# Patient Record
Sex: Female | Born: 1970 | Hispanic: No | Marital: Married | Smoking: Never smoker
Health system: Southern US, Community
[De-identification: ages and names within clinical notes are randomized; demographics above are authoritative.]

## PROBLEM LIST (undated history)

## (undated) DIAGNOSIS — B159 Hepatitis A without hepatic coma: Secondary | ICD-10-CM

## (undated) HISTORY — DX: Hepatitis a without hepatic coma: B15.9

## (undated) HISTORY — PX: TUBAL LIGATION: SHX77

---

## 2007-04-12 ENCOUNTER — Observation Stay (HOSPITAL_COMMUNITY): Admission: AD | Admit: 2007-04-12 | Discharge: 2007-04-13 | Payer: Self-pay | Admitting: Obstetrics and Gynecology

## 2007-07-25 ENCOUNTER — Inpatient Hospital Stay (HOSPITAL_COMMUNITY): Admission: RE | Admit: 2007-07-25 | Discharge: 2007-07-28 | Payer: Self-pay | Admitting: Obstetrics and Gynecology

## 2007-07-29 ENCOUNTER — Encounter (HOSPITAL_COMMUNITY): Admission: RE | Admit: 2007-07-29 | Discharge: 2007-08-27 | Payer: Self-pay | Admitting: Obstetrics and Gynecology

## 2007-08-06 ENCOUNTER — Inpatient Hospital Stay (HOSPITAL_COMMUNITY): Admission: AD | Admit: 2007-08-06 | Discharge: 2007-08-06 | Payer: Self-pay | Admitting: Obstetrics and Gynecology

## 2007-08-28 ENCOUNTER — Encounter: Admission: RE | Admit: 2007-08-28 | Discharge: 2007-09-27 | Payer: Self-pay | Admitting: Obstetrics and Gynecology

## 2007-09-28 ENCOUNTER — Encounter: Admission: RE | Admit: 2007-09-28 | Discharge: 2007-10-28 | Payer: Self-pay | Admitting: Obstetrics and Gynecology

## 2007-10-29 ENCOUNTER — Encounter: Admission: RE | Admit: 2007-10-29 | Discharge: 2007-11-25 | Payer: Self-pay | Admitting: Obstetrics and Gynecology

## 2007-11-26 ENCOUNTER — Encounter: Admission: RE | Admit: 2007-11-26 | Discharge: 2007-12-26 | Payer: Self-pay | Admitting: Obstetrics and Gynecology

## 2007-12-27 ENCOUNTER — Encounter: Admission: RE | Admit: 2007-12-27 | Discharge: 2008-01-25 | Payer: Self-pay | Admitting: Obstetrics and Gynecology

## 2008-01-26 ENCOUNTER — Encounter: Admission: RE | Admit: 2008-01-26 | Discharge: 2008-02-25 | Payer: Self-pay | Admitting: Obstetrics and Gynecology

## 2008-11-19 IMAGING — US US OB COMP +14 WK
1 series · 14 of 28 positions shown · non-contrast
Comparison: none

OBSTETRICAL ULTRASOUND:

 This ultrasound exam was performed in the [HOSPITAL] Ultrasound Department.  The OB US report was generated in the AS system, and faxed to the ordering physician.  This report is also available in [REDACTED] PACS.

[Series 1: us ob comp +14 wk · 14 of 46 slices shown]
[im 2/46]
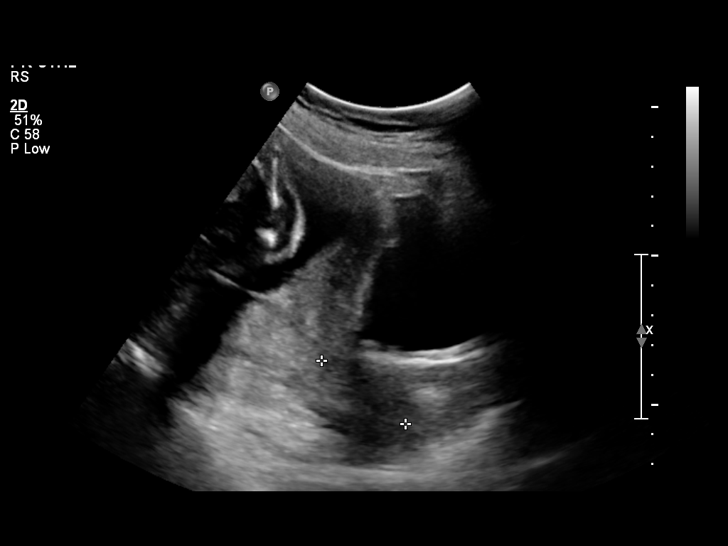
[im 6/46]
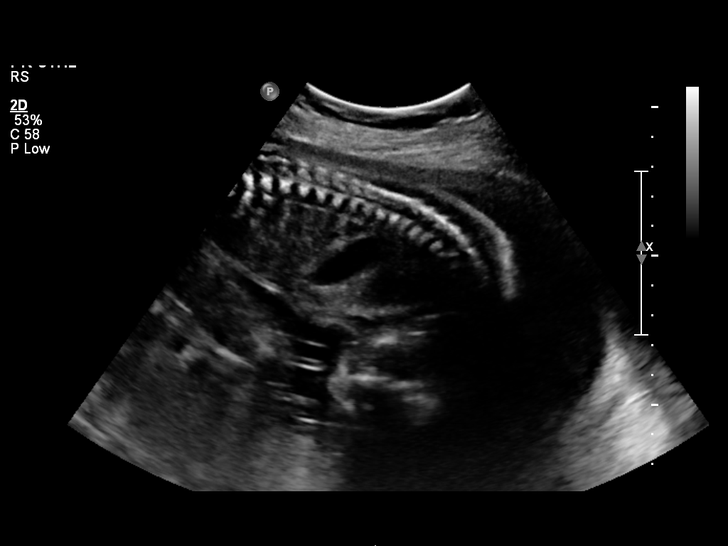
[im 9/46]
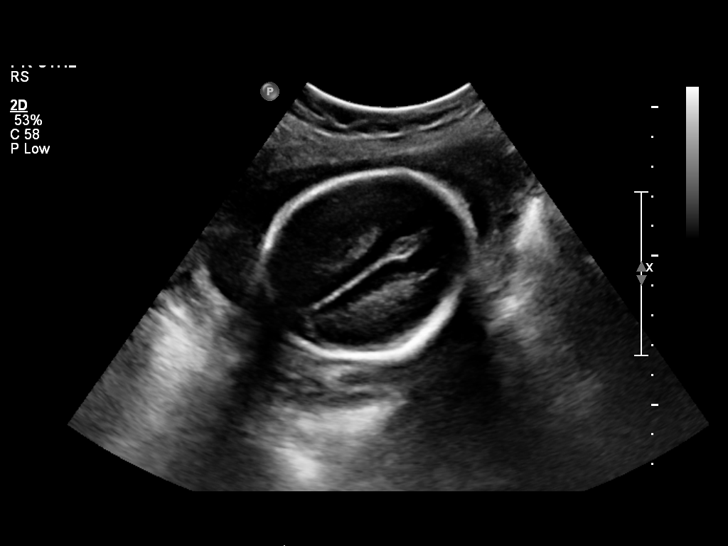
[im 12/46]
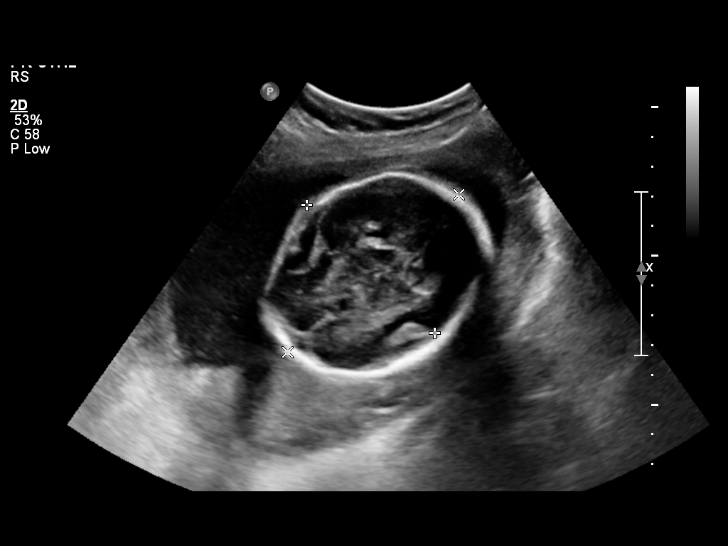
[im 16/46]
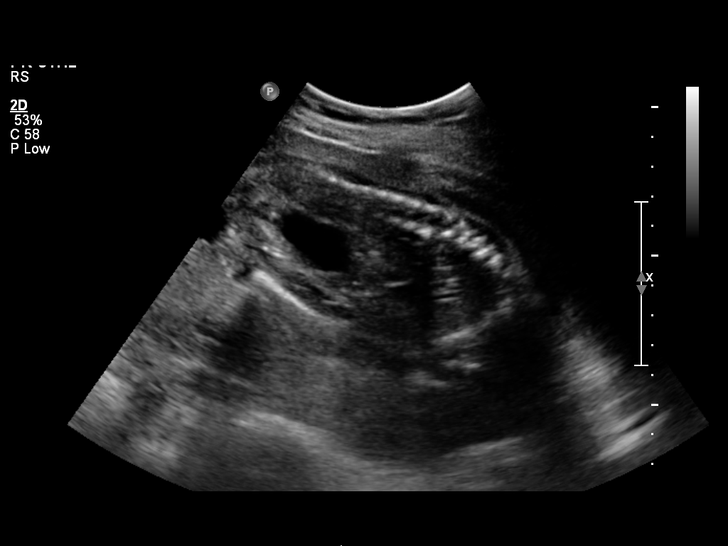
[im 19/46]
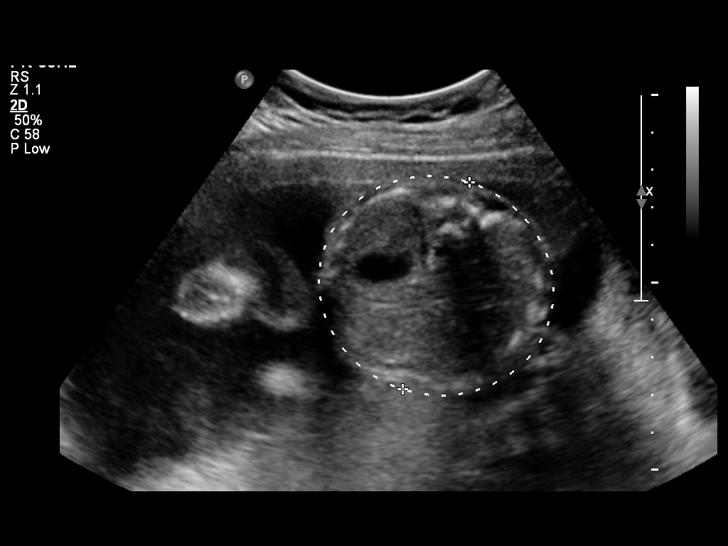
[im 22/46]
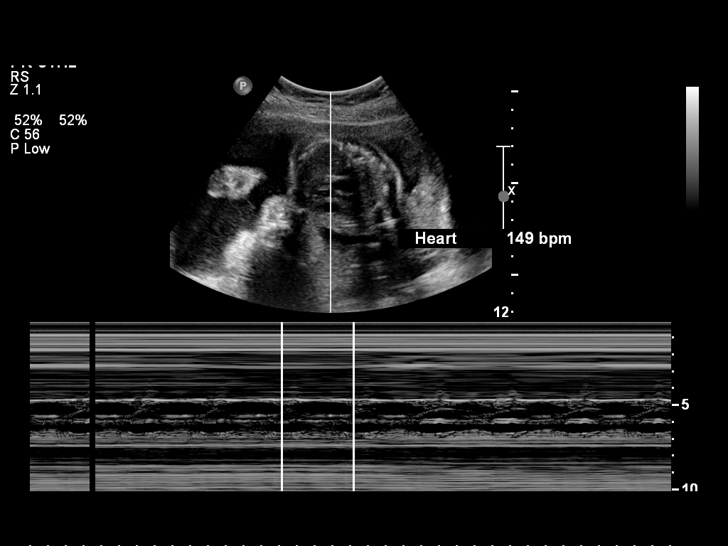
[im 26/46]
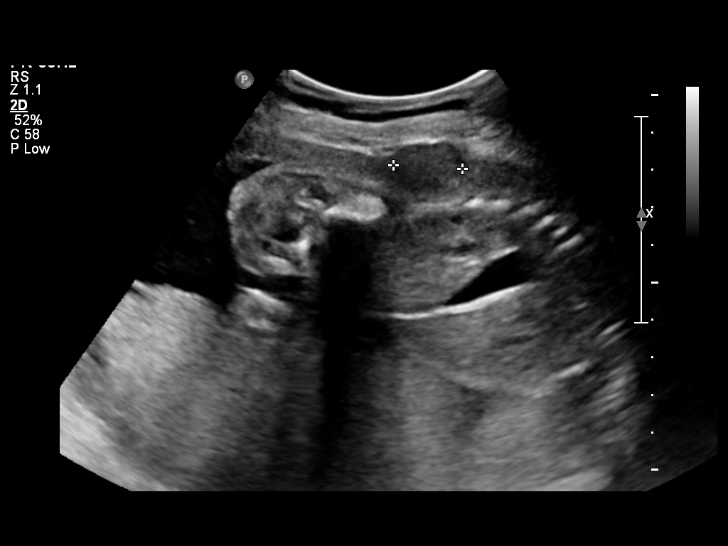
[im 29/46]
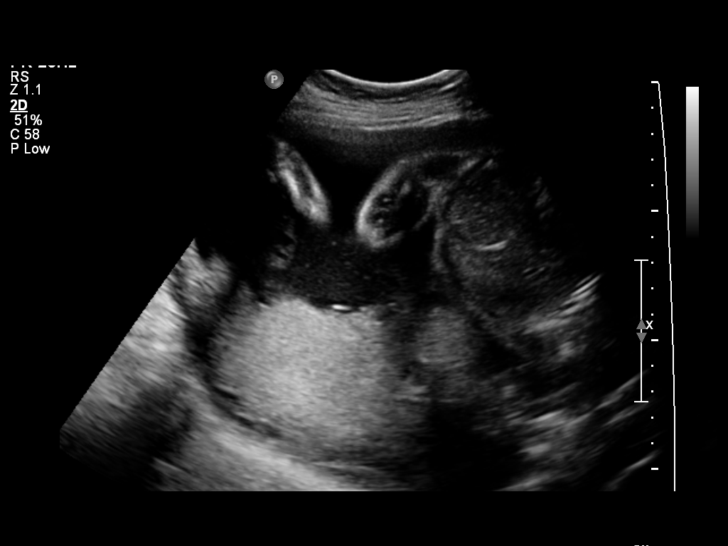
[im 32/46]
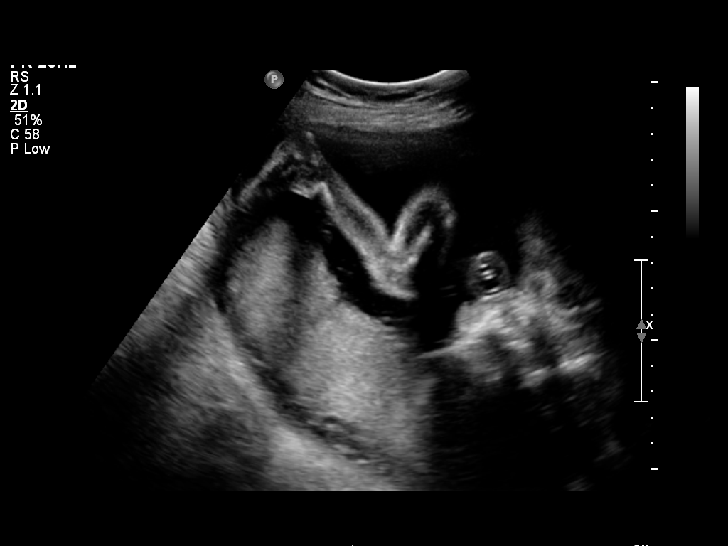
[im 36/46]
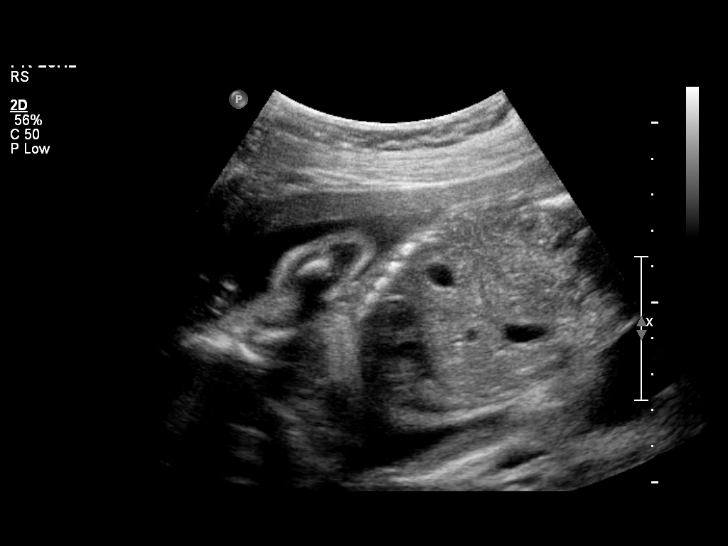
[im 39/46]
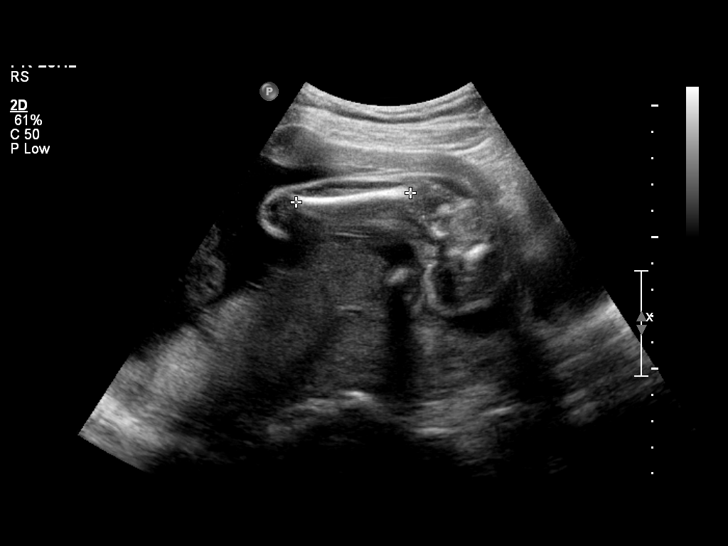
[im 42/46]
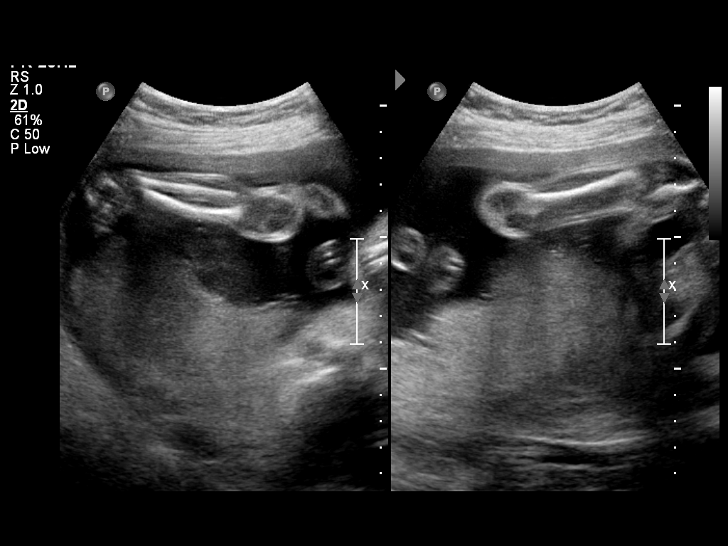
[im 46/46]
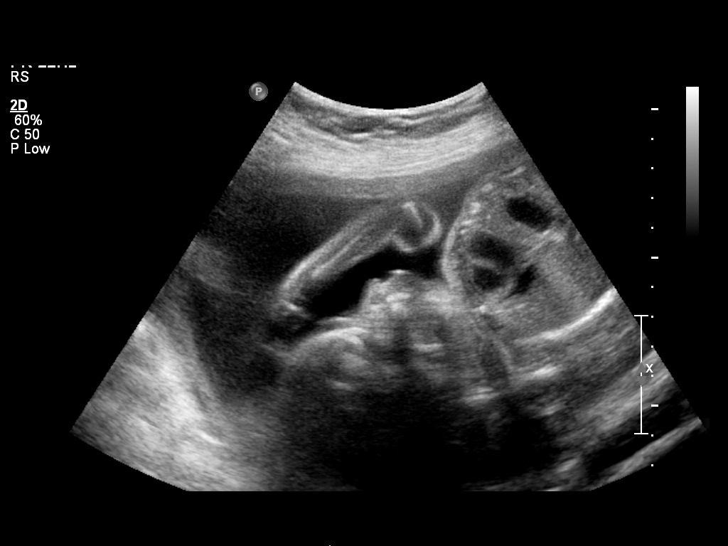

[14 of 28 positions shown; findings below may reference images not displayed]

IMPRESSION: See AS Obstetric US report.

## 2010-10-17 LAB — GC/CHLAMYDIA PROBE AMP, GENITAL: Chlamydia: NEGATIVE

## 2010-10-17 LAB — HEPATITIS B SURFACE ANTIGEN: Hepatitis B Surface Ag: NEGATIVE

## 2010-10-17 LAB — ABO/RH

## 2010-10-17 LAB — RUBELLA ANTIBODY, IGM: Rubella: IMMUNE

## 2011-01-23 NOTE — Discharge Summary (Signed)
Tiffany Fuller, Tiffany Fuller             ACCOUNT NO.:  [id number]   MEDICAL RECORD NO.:  [id number]          PATIENT TYPE:  INP   LOCATION:  9161                          [facility]   PHYSICIAN:  Janine Limbo, M.D.DATE OF BIRTH:  May 13, 1971   DATE OF ADMISSION:  04/12/2007  DATE OF DISCHARGE:  04/13/2007                               DISCHARGE SUMMARY   ADMISSION DIAGNOSES:  1. Intrauterine pregnancy at 24-1/7 weeks.  2. Status post maternal trauma from fall.   DISCHARGE DIAGNOSES:  1. Intrauterine pregnancy at 24-1/7 weeks.  2. Status post maternal trauma from fall.  3. Reassuring fetal status and no contractions.   HOSPITAL PROCEDURES:  1. Electronic fetal monitoring.  2. Ultrasound.   HOSPITAL COURSE:  The patient was admitted after falling in the  bathroom, striking her belly on the tub.  She had some mild cramping and  was admitted for overnight observation.  She had no obvious evidence of  trauma. Fetal heart rate was 150s and reassuring.  She was placed on the  monitor for overnight monitoring.  She had an ultrasound done showing  normal fluid.  Cervix was closed and long. Placenta was normal. Cultures  were done. Fetal fibronectin was done and was negative.  She was given  ibuprofen for tocolysis, and contractions ceased.   The next morning she was found to be afebrile with stable vital signs.  Fetal heart rate was stable.  There were no contractions. Kleihauer  Becky was negative.  Her blood type was A+.  Hemoglobin was 10.8.  Abdomen was nontender.  Chest was clear.  Heart rate regular rate and  rhythm.  She was deemed to have received full benefit of her hospital  stay and was discharged home.   DISCHARGE MEDICATIONS:  1. Prenatal vitamins 1 p.o. daily.  2. Ibuprofen 600 mg one p.o. q.6 h p.r.n.   DISCHARGE LABORATORY DATA:  Hemoglobin 10.8, white blood cell count  10.2, platelets 263.  Urinalysis negative.   DISCHARGE INSTRUCTIONS:  Include normal  prenatal care with monitoring  for contractions and fetal activity.   Discharge followup in 1-2 weeks in the office.   CONDITION ON DISCHARGE:  Good.      Marie L. Williams, C.N.M.      Janine Limbo, M.D.  Electronically Signed    MLW/MEDQ  D:  04/13/2007  T:  04/13/2007  Job:  [id number]

## 2011-01-23 NOTE — H&P (Signed)
Tiffany Fuller             ACCOUNT NO.:  [id number]   MEDICAL RECORD NO.:  [id number]          PATIENT TYPE:  INP   LOCATION:  NA                            [facility]   PHYSICIAN:  Dois Davenport A. Rivard, M.D. DATE OF BIRTH:  [birth year]   DATE OF ADMISSION:  07/25/2007  DATE OF DISCHARGE:                              HISTORY & PHYSICAL   Tiffany Fuller is a 40 year old gravida 1, para 0, at 42 and 2/7ths weeks who  presents on the date of admission for scheduled elective primary  cesarean section.   The patient's history is remarkable for:  1. Advanced maternal age with amnio decline.  2. History of infertility.  3. Positive group B strep.  4. Desiring primary elective cesarean section.  5. Initial Down syndrome risk of 1 in 27 on quad screen, decreased to      1 in 604 with normal ultrasound.   PRENATAL LABORATORIES:  Blood type is A positive, Rh antibody negative,  VDRL nonreactive, rubella titer positive, hepatitis B surface antigen  negative, HIV nonreactive.  GC and Chlamydia cultures were negative in  the first trimester.  Pap was done and was normal.  The patient had a  first trimester screen.  Quadruple screen was done and showed an  elevated Down syndrome risk of 1 in 27, and with a normal ultrasound at  18 weeks, the risk went to 1 in 604.  She had had an early ultrasound in  April that confirmed dating purposes.  She had some low back pain at 19  weeks.  She had some glycosuria at 26 weeks with a normal CBG.  She had  an elevated Glucola of 137.  She had a normal 3-hour GTT.  By 30 weeks,  she was discussing her possible desire for primary cesarean section.  She had had one panic attack in the past.  Group B strep culture was  done at 36 weeks and was positive.  She was measuring slightly size less  than dates.  At 36 weeks, growth was normal at the 44th percentile and  normal fluid.  By 36 weeks, she was definitely desiring a primary  cesarean section.  She fully  understood the risks and benefits.  The  rest of her pregnancy was essentially uncomplicated.   OBSTETRICAL HISTORY:  The patient is a prima gravida.   MEDICAL HISTORY:  1. She has been an infertility patient for 2 years.  She had some      testing done in [place], but this was a spontaneous conception.  2. She reports the usual childhood illnesses.  3. The patient had a history of cystitis in 2007.  4. She had hepatitis when she was 40 years old.   ALLERGIES:  She has no known medication allergies.   FAMILY HISTORY:  Her mother is hypothyroid.  Maternal grandmother had  breast cancer.  Maternal aunt has cervical cancer.  Sister is a smoker.   GENETIC HISTORY:  Remarkable for the patient's age of 12.   SOCIAL HISTORY:  The patient is married to the father of the baby.  He  is involved and supportive.  His name is Tiffany Fuller.  The patient is  Caucasian.  She denies any religious affiliation.  The patient college  educated.  She is employed in [employer], [employer].  Her husband is also  college educated and he is an Art gallery manager.  She has been followed by the  physician service at [facility].  She denies any alcohol, drug  or tobacco use during this pregnancy.   PHYSICAL EXAMINATION:  VITAL SIGNS:  Stable.  The patient is afebrile.  HEENT:  Within normal limits.  LUNGS:  The breath sounds are clear.  HEART:  Regular rate and rhythm without murmur.  BREASTS:  Soft and nontender.  ABDOMEN:  Fundal height is approximately 37 cm.  Estimated fetal weight  7 pounds.  Uterine contractions are very occasional and mild.  Fetal  heart rate has been in the 140s in the office.  Pelvic exam is deferred.  EXTREMITIES:  Deep tendon reflexes are 2+ without clonus.  There is a  trace edema noted.  Last cervical exam on July 16, 2007 was closed,  50%, vertex at a zero station.   IMPRESSION:  1. Intrauterine pregnancy at 50 and 2/7ths weeks.  2. Desiring primary cesarean section.  3.  Positive group B strep.   PLAN:  1. Admit to [facility] for consult with Dr. Silverio Lay as attending physician.  2. Routine physician preoperative orders.  3. Risks and benefits of primary elective cesarean section were      reviewed with the patient by Dr. Estanislado Pandy, and the patient does wish      to continue to proceed with the plan.      Tiffany Fuller, C.N.M.      Crist Fat Rivard, M.D.  Electronically Signed    VLL/MEDQ  D:  07/23/2007  T:  07/23/2007  Job:  [id number]

## 2011-01-23 NOTE — H&P (Signed)
NAMEEDYTH, GLOMB NO.:  [id number]   MEDICAL RECORD NO.:  [id number]          PATIENT TYPE:  MAT   LOCATION:  MATC                          [facility]   PHYSICIAN:  Janine Limbo, M.D.DATE OF BIRTH:  08-19-1971   DATE OF ADMISSION:  04/12/2007  DATE OF DISCHARGE:                              HISTORY & PHYSICAL   Ms. Bouley is a 40 year old gravida 1 para 0 at 24-1/7 weeks who presented  status post falling in bathroom and striking her belly on the tub.  She  denies any leaking or bleeding.  Reports positive fetal movement.  She  did not strike any other part of her body.  She does report a very  slight amount of mild cramping early since that time, but no bleeding or  leaking.  Pregnancy is remarkable for:  1)  advanced maternal age, with  genetic testing declined; and 2)  infertility.   While in maternity admissions unit, the patient was noted to have a  series of contractions, of which she was not aware.  Therefore, the  decision was made to admit her for 23-hour observation status post her  fall.   PRENATAL LABORATORIES:  Blood type is A positive.  Rh antibody negative.  VDRL nonreactive.  Rubella titer positive.  Hepatitis B surface antigen  negative.  HIV nonreactive.  GC and Chlamydia cultures were negative in  the first trimester.  Pap was normal.  Hemoglobin upon entering into the  practice was 11.9.  She declined amniocentesis.  She had a quadruple  screen that did show elevated Down's syndrome risk, 1 in 27.  Then she  had a level 2 ultrasound.  She continued to decline amniocentesis after  that.   OBSTETRICAL HISTORY:  The patient is a primigravida.   MEDICAL HISTORY:  1. She was tested for infertility for 2 years.  She had testing in      [place].  2. She reports the usual childhood illnesses.  3. She had a UTI in 2007.  4. She was hospitalized for hepatitis when she was 40 years old.   She has no known medication allergies,   FAMILY HISTORY:  Her mother has hypothyroidism.  Her maternal  grandmother had breast cancer.  Her maternal aunt had cervical cancer.   GENETIC HISTORY:  Remarkable for the patient's age of 66.   SOCIAL HISTORY:  The patient is married to the father of the baby.  He  is involved and supportive.  His name is Woodfin Ganja.  The patient is  college educated.  She is employed in [employer], [employer].  Her husband is  also college educated.  He is an Art gallery manager.  She has been followed by the  physician service at [facility].  She denies any alcohol, drug,  or tobacco use during this pregnancy.   PHYSICAL EXAMINATION:  VITAL SIGNS:  Stable.  The patient is afebrile.  HEENT:  Within normal limits.  LUNGS:  Her breath sounds are clear.  HEART:  Regular rate and rhythm, without murmur.  BREASTS:  Soft and nontender.  ABDOMEN:  Fundal height  is approximately 24-25 cm.  Abdomen is soft and  nontender.  There is no evidence of trauma noted.  PELVIC:  Cervix is closed and long.  GC, Chlamydia, beta Streptococcus,  and fetal fibronectin were done prior to the exam.  Wet prep was  negative.  UA is pending.  Fetal heart rate is reassuring, with a  baseline in the 150s.  There was mild irritability initially on the  monitor, which then resolved spontaneously.  Then, the patient went to  ultrasound.  Ultrasound showed transverse lie, fetus with normal fluid,  placenta was normal, no evidence of trauma, and the placenta was  posterior.  Estimated fetal weight was 648 g, 1 pound 7 ounces, at the  42nd percentile.  Cervix was 3.52 cm.  She did have a series of every 2-  3 minute contractions, which the patient was not aware of.  She was  given ibuprofen 600 mg p.o. to begin a 24-hour course, q.6 h., and the  decision was made to admit for 23-hour observation.   IMPRESSION:  1. Intrauterine pregnancy at 24-1/7 weeks.  2. Status post maternal trauma, with some contractions.   PLAN:  1. Admit to [facility] for consult with Dr. Janine Limbo as attending physician.  2. Routine antenatal orders.  3. Plan continuous electronic fetal monitoring.  4. Will give ibuprofen 600 mg p.o. q.6 h. x24 hours.  5. Anticipate discharge tomorrow.      Renaldo Reel Emilee Hero, C.N.M.      Janine Limbo, M.D.  Electronically Signed    VLL/MEDQ  D:  04/12/2007  T:  04/12/2007  Job:  [id number]

## 2011-01-23 NOTE — Discharge Summary (Signed)
NAMESAWSAN, RIGGIO             ACCOUNT NO.:  [id number]   MEDICAL RECORD NO.:  [id number]          PATIENT TYPE:  INP   LOCATION:  9101                          [facility]   PHYSICIAN:  Hal Morales, M.D.DATE OF BIRTH:  1971/04/28   DATE OF ADMISSION:  07/25/2007  DATE OF DISCHARGE:  07/28/2007                               DISCHARGE SUMMARY   ADMITTING DIAGNOSES:  1. Intrauterine pregnancy at 39-2/7 weeks.  2. Desires elective primary cesarean section.   DISCHARGE DIAGNOSES:  1. Intrauterine pregnancy at 39-2/7 weeks.  2. Desires elective primary cesarean section.   PROCEDURES:  1. Primary low transverse cesarean section.  2. Spinal anesthesia.   HOSPITAL COURSE:  Ms. Chhim is a 40 year old gravida 1, para 0 at 39-2/7  weeks who presents on date of admission for scheduled elective primary  cesarean section.  The patient's history is remarkable for:   1. Advanced maternal age with amniocentesis decline.  2. History of infertility.  3. Positive group B strep.  4. Desires primary elective cesarean section.  5. Initial Down syndrome risk of 1 in 27 on quadrascreen, decreased to      1 in 604 with normal ultrasound.   The patient was taken to the operating room where Dr. Estanislado Pandy performed a  primary low transverse cesarean section for a viable female.  They named  it [name], weight 6 pounds 7 ounces, Apgars 8 and 9.  Infant was  taken to the full term nursery.  Mother was taken to recovery in good  condition.  The cesarean was performed under spinal anesthesia.  On  postop day #1 the patient is doing well.  She was up ad lib.  She was  tolerating regular diet.  She was working on breastfeeding.  Hemoglobin  was 10.3 down from 12.1.  White blood cell count was 9.9, platelet count  185.  Her incision was clean, dry, and intact.  There was a small amount  of old dry drainage noted on the left hand border.  The rest of the  patient's hospital stay was uncomplicated.   She was up ad lib.  That  same small amount of old scab was adherent to the left border of  incision, but there was no active bleeding.  The patient had good pain  control with Motrin and Percocet.  She was undecided regarding  contraception.  She was deemed to receive the benefit of her hospital  stay.  Was discharged home on November 17.   DISCHARGE INSTRUCTIONS:  Per [facility] handout.   DISCHARGE MEDICATIONS:  1. Motrin 600 mg p.o. q.6 h. p.r.n. pain.  2. Percocet 5/325 1-2 p.o. q.3-4 h. p.r.n. pain.  3. The patient may also use over-the-counter Colace for stool softener      as needed.   DISCHARGE FOLLOWUP:  Followup with [facility].      Renaldo Reel Emilee Hero, C.N.M.      Hal Morales, M.D.  Electronically Signed    VLL/MEDQ  D:  07/28/2007  T:  07/28/2007  Job:  [id number]

## 2011-01-23 NOTE — Op Note (Signed)
Tiffany Fuller, HIPPERT             ACCOUNT NO.:  [id number]   MEDICAL RECORD NO.:  [id number]          PATIENT TYPE:  INP   LOCATION:  9101                          [facility]   PHYSICIAN:  Crist Fat. Rivard, M.D. DATE OF BIRTH:  Jan 04, 1971   DATE OF PROCEDURE:  07/25/2007  DATE OF DISCHARGE:                               OPERATIVE REPORT   PREOPERATIVE DIAGNOSIS:  Intrauterine pregnancy at 39 weeks and 2 days  for elective cesarean section.   POSTOPERATIVE DIAGNOSIS:  Intrauterine pregnancy at 39 weeks and 2 days  for elective cesarean section.   ANESTHESIA:  Spinal, Dr. Tacy Dura.   PROCEDURE:  Primary low transverse cesarean section.   SURGEON:  Dr. Estanislado Pandy.   ASSISTANT:  Nigel Bridgeman, CNM.   ESTIMATED BLOOD LOSS:  500 mL.   DESCRIPTION OF PROCEDURE:  After being informed of the planned procedure  with possible complications including bleeding, infection, injury to  bowel, bladder or ureters, informed consent is obtained.  The patient is  taken to cesarean suite, given spinal anesthesia without any  complication.  She is placed in the dorsal decubitus position, pelvis  tilted to the left, prepped and draped in a sterile fashion and a Foley  catheter is inserted in her bladder.   After assessing adequate level of anesthesia, we infiltrate the  suprapubic area with 20 mL of Marcaine 0.25 and perform a Pfannenstiel  incision which was brought down sharply to the fascia.  The fascia is  incised in a low transverse fashion, the linea alba is dissected,  peritoneum is entered in a midline fashion.  An Alexis retractor is  placed without difficulty and the visceroperitoneum is entered in a low  transverse fashion allowing [place] to safely retract bladder by developing a  bladder flap. The myometrium is entered in a low transverse fashion  first with knife and then extended bluntly.  Amniotic fluid is clear.  We assist the birth of a female infant in vertex presentation.  Mouth  and  nose were suctioned. Nuchal cord is reduced, baby is delivered, cord  is clamped with two Kelly clamps and sectioned and the baby is given to  the pediatrician present in the room.  The placenta is allowed to  deliver spontaneously.  It is complete, the cord has three vessels and  the placenta is sent for cord blood donation.  Uterine revision is  negative.  The patient is given a dose of Ancef 2 grams.   We then proceed with closure of the myometrium with two layers first  with a running lock suture of #0 Vicryl then with a Lembert suture of  #Vicryl imbricating the first one.  Hemostasis is completed on  peritoneal edges with cauterization.  Both paracolic gutters are  cleaned, both tubes and ovaries assessed and normal and the pelvis is  profusely irrigated with warm saline.  Hemostasis is checked and deemed  adequate again.   Under fascia hemostasis is completed with cautery and the fascia is  closed with two running sutures of #1 Vicryl meeting midline.  The wound  is irrigated with warm saline, hemostasis  is completed with cautery and  the skin is closed with a subcuticular suture of 3-0 Monocryl and Steri-  Strips.   Instruments and sponge count is complete x2.  Estimated blood loss is  500 mL. The procedure is very well tolerated by the patient who is taken  to the recovery room in a well and stable condition.   A little girl named Tiffany Fuller was born at 7:46 a.m.,  received an Apgar  of 8 at 1 minute, 9 at 5 minutes and weighs 6 pounds 7 ounces.   SPECIMEN:  Placenta sent to cord blood donation and then to labor and  delivery.      Crist Fat Rivard, M.D.  Electronically Signed     SAR/MEDQ  D:  07/25/2007  T:  07/25/2007  Job:  [id number]

## 2011-04-13 ENCOUNTER — Encounter (HOSPITAL_COMMUNITY): Payer: Self-pay

## 2011-04-16 ENCOUNTER — Encounter (HOSPITAL_COMMUNITY): Payer: Self-pay

## 2011-04-16 ENCOUNTER — Encounter (HOSPITAL_COMMUNITY)
Admission: RE | Admit: 2011-04-16 | Discharge: 2011-04-16 | Disposition: A | Discharge status: Home / Self Care | Payer: BC Managed Care – PPO | Source: Ambulatory Visit | Attending: Obstetrics and Gynecology | Admitting: Obstetrics and Gynecology

## 2011-04-16 LAB — CBC
MCH: 28.6 pg (ref 26.0–34.0)
MCHC: 33.2 g/dL (ref 30.0–36.0)
Platelets: 166 10*3/uL (ref 150–400)
RBC: 4.3 MIL/uL (ref 3.87–5.11)
RDW: 14.3 % (ref 11.5–15.5)

## 2011-04-16 LAB — RPR: RPR Ser Ql: NONREACTIVE

## 2011-04-16 NOTE — Patient Instructions (Signed)
20 Debanhi Blaker  04/16/2011   Your procedure is scheduled on:  Tuesday   Enter through the Main Entrance of [facility] at 11 AM.  Pick up the phone at the desk and dial [phone].   Call this number if you have problems the morning of surgery: [phone]   Remember:   Do not eat food:After Midnight.  Do not drink clear liquids: 4 Hours before arrival.  Take these medicines the morning of surgery with A SIP OF WATER: x   Do not wear jewelry, make-up or nail polish.  Do not wear lotions, powders, or perfumes. You may wear deodorant.  Do not shave 48 hours prior to surgery.  Do not bring valuables to the hospital.  Contacts, dentures or bridgework may not be worn into surgery.  Leave suitcase in the car. After surgery it may be brought to your room.  For patients admitted to the hospital, checkout time is 11:00 AM the day of discharge.     Please read over the following fact sheets that you were given: chg shower per instructions

## 2011-04-27 ENCOUNTER — Other Ambulatory Visit: Payer: Self-pay | Admitting: Obstetrics and Gynecology

## 2011-05-01 ENCOUNTER — Encounter (HOSPITAL_COMMUNITY): Payer: Self-pay | Admitting: Anesthesiology

## 2011-05-01 ENCOUNTER — Inpatient Hospital Stay (HOSPITAL_COMMUNITY): Payer: BC Managed Care – PPO | Admitting: Anesthesiology

## 2011-05-01 ENCOUNTER — Encounter (HOSPITAL_COMMUNITY)
Admission: RE | Disposition: A | Discharge status: Home / Self Care | Payer: Self-pay | Source: Ambulatory Visit | Attending: Obstetrics and Gynecology

## 2011-05-01 ENCOUNTER — Inpatient Hospital Stay (HOSPITAL_COMMUNITY)
Admission: RE | Admit: 2011-05-01 | Discharge: 2011-05-03 | DRG: 371 | Disposition: A | Discharge status: Home / Self Care | Payer: BC Managed Care – PPO | Source: Ambulatory Visit | Attending: Obstetrics and Gynecology | Admitting: Obstetrics and Gynecology

## 2011-05-01 ENCOUNTER — Other Ambulatory Visit: Payer: Self-pay | Admitting: Obstetrics and Gynecology

## 2011-05-01 ENCOUNTER — Encounter (HOSPITAL_COMMUNITY): Payer: Self-pay | Admitting: *Deleted

## 2011-05-01 ENCOUNTER — Inpatient Hospital Stay: Payer: Self-pay | Admitting: Obstetrics and Gynecology

## 2011-05-01 ENCOUNTER — Inpatient Hospital Stay (HOSPITAL_COMMUNITY)
Admission: RE | Admit: 2011-05-01 | Payer: BC Managed Care – PPO | Source: Ambulatory Visit | Admitting: Obstetrics and Gynecology

## 2011-05-01 DIAGNOSIS — Z01812 Encounter for preprocedural laboratory examination: Secondary | ICD-10-CM

## 2011-05-01 DIAGNOSIS — Z302 Encounter for sterilization: Secondary | ICD-10-CM

## 2011-05-01 DIAGNOSIS — Z3490 Encounter for supervision of normal pregnancy, unspecified, unspecified trimester: Secondary | ICD-10-CM

## 2011-05-01 DIAGNOSIS — O09529 Supervision of elderly multigravida, unspecified trimester: Secondary | ICD-10-CM | POA: Diagnosis present

## 2011-05-01 DIAGNOSIS — Z01818 Encounter for other preprocedural examination: Secondary | ICD-10-CM

## 2011-05-01 DIAGNOSIS — O34219 Maternal care for unspecified type scar from previous cesarean delivery: Principal | ICD-10-CM | POA: Diagnosis present

## 2011-05-01 SURGERY — Surgical Case
Anesthesia: Spinal | Site: Abdomen | Laterality: Bilateral | Wound class: Clean Contaminated

## 2011-05-01 MED ORDER — CEFAZOLIN SODIUM 1-5 GM-% IV SOLN: 1.0000 g | INTRAVENOUS | Status: DC

## 2011-05-01 MED ORDER — OXYTOCIN 20 UNITS IN LACTATED RINGERS INFUSION - SIMPLE
125.0000 mL/h | INTRAVENOUS | Status: AC
  Administered 2011-05-01: 125 mL/h via INTRAVENOUS
  Filled 2011-05-01: qty 1000

## 2011-05-01 MED ORDER — PHENYLEPHRINE 40 MCG/ML (10ML) SYRINGE FOR IV PUSH (FOR BLOOD PRESSURE SUPPORT)
PREFILLED_SYRINGE | INTRAVENOUS | Status: AC
  Filled 2011-05-01: qty 5

## 2011-05-01 MED ORDER — NALBUPHINE SYRINGE 5 MG/0.5 ML
5.0000 mg | INJECTION | INTRAMUSCULAR | Status: DC | PRN
  Filled 2011-05-01: qty 1

## 2011-05-01 MED ORDER — SCOPOLAMINE 1 MG/3DAYS TD PT72
1.0000 | MEDICATED_PATCH | Freq: Once | TRANSDERMAL | Status: DC
  Filled 2011-05-01: qty 1

## 2011-05-01 MED ORDER — DIPHENHYDRAMINE HCL 50 MG/ML IJ SOLN: 25.0000 mg | INTRAMUSCULAR | Status: DC | PRN

## 2011-05-01 MED ORDER — SODIUM CHLORIDE 0.9 % IJ SOLN: 3.0000 mL | INTRAMUSCULAR | Status: DC | PRN

## 2011-05-01 MED ORDER — OXYTOCIN 10 UNIT/ML IJ SOLN
INTRAMUSCULAR | Status: DC | PRN
  Administered 2011-05-01: 20 [IU]
  Administered 2011-05-01: 10 [IU]

## 2011-05-01 MED ORDER — ONDANSETRON HCL 4 MG/2ML IJ SOLN
INTRAMUSCULAR | Status: AC
  Filled 2011-05-01: qty 2

## 2011-05-01 MED ORDER — CEFAZOLIN SODIUM 1-5 GM-% IV SOLN
INTRAVENOUS | Status: DC | PRN
  Administered 2011-05-01 (×2): 1 g via INTRAVENOUS

## 2011-05-01 MED ORDER — WITCH HAZEL-GLYCERIN EX PADS: 1.0000 "application " | MEDICATED_PAD | CUTANEOUS | Status: DC | PRN

## 2011-05-01 MED ORDER — KETOROLAC TROMETHAMINE 30 MG/ML IJ SOLN: 30.0000 mg | Freq: Four times a day (QID) | INTRAMUSCULAR | Status: AC | PRN

## 2011-05-01 MED ORDER — LANOLIN HYDROUS EX OINT: 1.0000 "application " | TOPICAL_OINTMENT | CUTANEOUS | Status: DC | PRN

## 2011-05-01 MED ORDER — METHYLERGONOVINE MALEATE 0.2 MG PO TABS: 0.2000 mg | ORAL_TABLET | ORAL | Status: DC | PRN

## 2011-05-01 MED ORDER — MORPHINE SULFATE 0.5 MG/ML IJ SOLN
INTRAMUSCULAR | Status: AC
  Filled 2011-05-01: qty 10

## 2011-05-01 MED ORDER — METOCLOPRAMIDE HCL 5 MG/ML IJ SOLN: 10.0000 mg | Freq: Three times a day (TID) | INTRAMUSCULAR | Status: DC | PRN

## 2011-05-01 MED ORDER — SCOPOLAMINE 1 MG/3DAYS TD PT72
MEDICATED_PATCH | TRANSDERMAL | Status: AC
  Filled 2011-05-01: qty 1

## 2011-05-01 MED ORDER — DIPHENHYDRAMINE HCL 25 MG PO CAPS: 25.0000 mg | ORAL_CAPSULE | ORAL | Status: DC | PRN

## 2011-05-01 MED ORDER — DIPHENHYDRAMINE HCL 25 MG PO CAPS: 25.0000 mg | ORAL_CAPSULE | Freq: Four times a day (QID) | ORAL | Status: DC | PRN

## 2011-05-01 MED ORDER — PRENATAL PLUS 27-1 MG PO TABS
1.0000 | ORAL_TABLET | Freq: Every day | ORAL | Status: DC
  Administered 2011-05-02 – 2011-05-03 (×2): 1 via ORAL
  Filled 2011-05-01 (×2): qty 1

## 2011-05-01 MED ORDER — SODIUM CHLORIDE 0.9 % IV SOLN: 1.0000 ug/kg/h | INTRAVENOUS | Status: DC | PRN

## 2011-05-01 MED ORDER — NALOXONE HCL 0.4 MG/ML IJ SOLN: 0.4000 mg | INTRAMUSCULAR | Status: DC | PRN

## 2011-05-01 MED ORDER — ONDANSETRON HCL 4 MG/2ML IJ SOLN: 4.0000 mg | Freq: Three times a day (TID) | INTRAMUSCULAR | Status: DC | PRN

## 2011-05-01 MED ORDER — ONDANSETRON HCL 4 MG PO TABS: 4.0000 mg | ORAL_TABLET | ORAL | Status: DC | PRN

## 2011-05-01 MED ORDER — SIMETHICONE 80 MG PO CHEW: 80.0000 mg | CHEWABLE_TABLET | ORAL | Status: DC | PRN

## 2011-05-01 MED ORDER — MEPERIDINE HCL 25 MG/ML IJ SOLN: 6.2500 mg | INTRAMUSCULAR | Status: DC | PRN

## 2011-05-01 MED ORDER — PRENATAL PLUS 27-1 MG PO TABS: 1.0000 | ORAL_TABLET | Freq: Every day | ORAL | Status: DC

## 2011-05-01 MED ORDER — FERROUS SULFATE 325 (65 FE) MG PO TABS
325.0000 mg | ORAL_TABLET | Freq: Two times a day (BID) | ORAL | Status: DC
  Administered 2011-05-02 – 2011-05-03 (×3): 325 mg via ORAL
  Filled 2011-05-01 (×3): qty 1

## 2011-05-01 MED ORDER — CEFAZOLIN SODIUM 1-5 GM-% IV SOLN
INTRAVENOUS | Status: AC
  Filled 2011-05-01: qty 50

## 2011-05-01 MED ORDER — OXYTOCIN 10 UNIT/ML IJ SOLN
INTRAMUSCULAR | Status: AC
  Filled 2011-05-01: qty 1

## 2011-05-01 MED ORDER — SIMETHICONE 80 MG PO CHEW
80.0000 mg | CHEWABLE_TABLET | Freq: Three times a day (TID) | ORAL | Status: DC
  Administered 2011-05-01 – 2011-05-03 (×5): 80 mg via ORAL

## 2011-05-01 MED ORDER — TETANUS-DIPHTH-ACELL PERTUSSIS 5-2.5-18.5 LF-MCG/0.5 IM SUSP
0.5000 mL | Freq: Once | INTRAMUSCULAR | Status: AC
  Administered 2011-05-02: 0.5 mL via INTRAMUSCULAR
  Filled 2011-05-01: qty 0.5

## 2011-05-01 MED ORDER — KETOROLAC TROMETHAMINE 60 MG/2ML IM SOLN
60.0000 mg | Freq: Once | INTRAMUSCULAR | Status: AC | PRN
  Administered 2011-05-01: 60 mg via INTRAMUSCULAR

## 2011-05-01 MED ORDER — IBUPROFEN 600 MG PO TABS
600.0000 mg | ORAL_TABLET | Freq: Four times a day (QID) | ORAL | Status: DC
  Administered 2011-05-02 – 2011-05-03 (×6): 600 mg via ORAL
  Filled 2011-05-01 (×5): qty 1

## 2011-05-01 MED ORDER — ZOLPIDEM TARTRATE 5 MG PO TABS: 5.0000 mg | ORAL_TABLET | Freq: Every evening | ORAL | Status: DC | PRN

## 2011-05-01 MED ORDER — METHYLERGONOVINE MALEATE 0.2 MG/ML IJ SOLN: 0.2000 mg | INTRAMUSCULAR | Status: DC | PRN

## 2011-05-01 MED ORDER — OXYCODONE-ACETAMINOPHEN 5-325 MG PO TABS
1.0000 | ORAL_TABLET | ORAL | Status: DC | PRN
  Administered 2011-05-03 (×2): 1 via ORAL
  Filled 2011-05-01 (×2): qty 1

## 2011-05-01 MED ORDER — ONDANSETRON HCL 4 MG/2ML IJ SOLN: 4.0000 mg | INTRAMUSCULAR | Status: DC | PRN

## 2011-05-01 MED ORDER — SENNOSIDES-DOCUSATE SODIUM 8.6-50 MG PO TABS
2.0000 | ORAL_TABLET | Freq: Every day | ORAL | Status: DC
  Administered 2011-05-01 – 2011-05-02 (×2): 2 via ORAL

## 2011-05-01 MED ORDER — SCOPOLAMINE 1 MG/3DAYS TD PT72
1.0000 | MEDICATED_PATCH | TRANSDERMAL | Status: DC
  Administered 2011-05-01: 1.5 mg via TRANSDERMAL

## 2011-05-01 MED ORDER — LACTATED RINGERS IV SOLN
INTRAVENOUS | Status: DC
  Administered 2011-05-01 (×4): via INTRAVENOUS

## 2011-05-01 MED ORDER — PHENYLEPHRINE HCL 10 MG/ML IJ SOLN
INTRAMUSCULAR | Status: DC | PRN
  Administered 2011-05-01: 160 ug via INTRAVENOUS
  Administered 2011-05-01: 120 ug via INTRAVENOUS
  Administered 2011-05-01: 80 ug via INTRAVENOUS

## 2011-05-01 MED ORDER — KETOROLAC TROMETHAMINE 60 MG/2ML IM SOLN
INTRAMUSCULAR | Status: AC
  Filled 2011-05-01: qty 2

## 2011-05-01 MED ORDER — MEASLES, MUMPS & RUBELLA VAC ~~LOC~~ INJ
0.5000 mL | INJECTION | Freq: Once | SUBCUTANEOUS | Status: DC
  Filled 2011-05-01: qty 0.5

## 2011-05-01 MED ORDER — ONDANSETRON HCL 4 MG/2ML IJ SOLN
INTRAMUSCULAR | Status: DC | PRN
  Administered 2011-05-01: 4 mg via INTRAVENOUS

## 2011-05-01 MED ORDER — IBUPROFEN 600 MG PO TABS
600.0000 mg | ORAL_TABLET | Freq: Four times a day (QID) | ORAL | Status: DC | PRN
  Filled 2011-05-01 (×2): qty 1

## 2011-05-01 MED ORDER — MENTHOL 3 MG MT LOZG: 1.0000 | LOZENGE | OROMUCOSAL | Status: DC | PRN

## 2011-05-01 MED ORDER — BUPIVACAINE HCL (PF) 0.25 % IJ SOLN
INTRAMUSCULAR | Status: DC | PRN
  Administered 2011-05-01: 20 mL

## 2011-05-01 MED ORDER — DIBUCAINE 1 % RE OINT: 1.0000 "application " | TOPICAL_OINTMENT | RECTAL | Status: DC | PRN

## 2011-05-01 MED ORDER — DIPHENHYDRAMINE HCL 50 MG/ML IJ SOLN: 12.5000 mg | INTRAMUSCULAR | Status: DC | PRN

## 2011-05-01 MED ORDER — OXYTOCIN 10 UNIT/ML IJ SOLN
INTRAMUSCULAR | Status: AC
  Filled 2011-05-01: qty 2

## 2011-05-01 MED ORDER — FENTANYL CITRATE 0.05 MG/ML IJ SOLN
INTRAMUSCULAR | Status: AC
  Filled 2011-05-01: qty 2

## 2011-05-01 SURGICAL SUPPLY — 39 items
BENZOIN TINCTURE PRP APPL 2/3 (GAUZE/BANDAGES/DRESSINGS) ×2 IMPLANT
BOOTIES KNEE HIGH SLOAN (MISCELLANEOUS) ×4 IMPLANT
CHLORAPREP W/TINT 26ML (MISCELLANEOUS) ×2 IMPLANT
CLOTH BEACON ORANGE TIMEOUT ST (SAFETY) ×2 IMPLANT
DERMABOND ADVANCED (GAUZE/BANDAGES/DRESSINGS) ×2 IMPLANT
DRAIN JACKSON PRT FLT 10 (DRAIN) IMPLANT
DRESSING TELFA 8X3 (GAUZE/BANDAGES/DRESSINGS) ×2 IMPLANT
ELECT REM PT RETURN 9FT ADLT (ELECTROSURGICAL) ×2
ELECTRODE REM PT RTRN 9FT ADLT (ELECTROSURGICAL) ×1 IMPLANT
EVACUATOR SILICONE 100CC (DRAIN) IMPLANT
EXTRACTOR VACUUM M CUP 4 TUBE (SUCTIONS) IMPLANT
GAUZE SPONGE 4X4 12PLY STRL LF (GAUZE/BANDAGES/DRESSINGS) ×4 IMPLANT
GLOVE BIOGEL PI IND STRL 7.0 (GLOVE) ×2 IMPLANT
GLOVE BIOGEL PI INDICATOR 7.0 (GLOVE) ×2
GLOVE ECLIPSE 6.5 STRL STRAW (GLOVE) ×2 IMPLANT
GOWN PREVENTION PLUS LG XLONG (DISPOSABLE) ×6 IMPLANT
KIT ABG SYR 3ML LUER SLIP (SYRINGE) IMPLANT
NEEDLE HYPO 22GX1.5 SAFETY (NEEDLE) ×2 IMPLANT
NEEDLE HYPO 25X5/8 SAFETYGLIDE (NEEDLE) IMPLANT
NS IRRIG 1000ML POUR BTL (IV SOLUTION) ×4 IMPLANT
PACK C SECTION WH (CUSTOM PROCEDURE TRAY) ×2 IMPLANT
PAD ABD 7.5X8 STRL (GAUZE/BANDAGES/DRESSINGS) ×2 IMPLANT
PAD ABD DERMACEA PRESS 5X9 (GAUZE/BANDAGES/DRESSINGS) ×2 IMPLANT
RTRCTR C-SECT PINK 25CM LRG (MISCELLANEOUS) ×2 IMPLANT
SLEEVE SCD COMPRESS KNEE MED (MISCELLANEOUS) IMPLANT
STRIP CLOSURE SKIN 1/2X4 (GAUZE/BANDAGES/DRESSINGS) ×2 IMPLANT
STRIP CLOSURE SKIN 1/4X4 (GAUZE/BANDAGES/DRESSINGS) ×2 IMPLANT
SUT CHROMIC GUT AB #0 18 (SUTURE) IMPLANT
SUT MNCRL AB 3-0 PS2 27 (SUTURE) ×2 IMPLANT
SUT SILK 2 0 FSL 18 (SUTURE) IMPLANT
SUT VIC AB 0 CTX 36 (SUTURE) ×3
SUT VIC AB 0 CTX36XBRD ANBCTRL (SUTURE) ×3 IMPLANT
SUT VIC AB 1 CT1 36 (SUTURE) ×4 IMPLANT
SUT VIC AB 3-0 SH 27 (SUTURE) ×1
SUT VIC AB 3-0 SH 27XBRD (SUTURE) ×1 IMPLANT
SYR 20CC LL (SYRINGE) ×2 IMPLANT
TOWEL OR 17X24 6PK STRL BLUE (TOWEL DISPOSABLE) ×4 IMPLANT
TRAY FOLEY CATH 14FR (SET/KITS/TRAYS/PACK) ×2 IMPLANT
WATER STERILE IRR 1000ML POUR (IV SOLUTION) ×2 IMPLANT

## 2011-05-01 NOTE — Anesthesia Preprocedure Evaluation (Addendum)
Anesthesia Evaluation  Name, MR# and DOB Patient awake  General Assessment Comment  Reviewed: Allergy & Precautions, H&P , Patient's Chart, lab work & pertinent test results  Airway Mallampati: II TM Distance: >3 FB Neck ROM: full    Dental No notable dental hx.    Pulmonary  clear to auscultation  pulmonary exam normalPulmonary Exam Normal breath sounds clear to auscultation none    Cardiovascular Exercise Tolerance: Good regular Normal    Neuro/Psych   GI/Hepatic/Renal   Endo/Other    Abdominal   Musculoskeletal   Hematology   Peds  Reproductive/Obstetrics    Anesthesia Other Findings             Anesthesia Physical Anesthesia Plan  ASA: II  Anesthesia Plan: Spinal   Post-op Pain Management:    Induction:   Airway Management Planned:   Additional Equipment:   Intra-op Plan:   Post-operative Plan:   Informed Consent: I have reviewed the patients History and Physical, chart, labs and discussed the procedure including the risks, benefits and alternatives for the proposed anesthesia with the patient or authorized representative who has indicated his/her understanding and acceptance.   Dental Advisory Given  Plan Discussed with: CRNA  Anesthesia Plan Comments: (Lab work confirmed with CRNA in room. Platelets okay. Discussed spinal anesthetic, and patient consents to the procedure:  included risk of possible headache,backache, failed block, allergic reaction, and nerve injury. This patient was asked if she had any questions or concerns before the procedure started. )        Anesthesia Quick Evaluation  

## 2011-05-01 NOTE — Interval H&P Note (Signed)
History and Physical Interval Note:   05/01/2011   12:37 PM   Tiffany Fuller  has presented today for surgery, with the diagnosis of Previous Cesarian Sectioon; Desires Sterilization  The various methods of treatment have been discussed with the patient and family. After consideration of risks, benefits and other options for treatment, the patient has consented to  Procedure(s): CESAREAN SECTION WITH BILATERAL TUBAL LIGATION as a surgical intervention .  I have reviewed the patients' chart and labs.  Questions were answered to the patient's satisfaction.     Silverio Lay A  MD

## 2011-05-01 NOTE — Progress Notes (Signed)
FHR  135

## 2011-05-01 NOTE — H&P (Signed)
Tiffany Fuller is an 40 y.o. female G2P1 at 26 2/7 weeks, presenting for scheduled repeat C/S and BTL.  Pregnancy remarkable for: AMA Previous C/S with plan for repeat Desires BTL Hx infertility. GBS negative  OB History: G2, P1, with elective C/S in 2008 Had infertility prior to last pregnancy, but this pregnancy spontaneous +GBS previous pregnancy Had glycosuria with last pregnancy, but normal 3h GTT  History of pregnant pregnancy: Entered care at 11 weeks, with a plan for repeat C/S with Dr. Estanislado Pandy.  Had normal 1st trimester screen and normal AFP.  Had EDC of 05/06/11 established by 1st trimester U/S. Had normal anatomy scan at 18 weeks, with anterior placenta.  Had elevated glucola of 148, with normal 3h GTT.  Had another U/S at 37 weeks, with EFW of 7+1, normal fluid. Decided on BTL during pregnancy.  GBS negative at 36 weeks.  Menstrual History: Menarche age: 10 Patient's last menstrual period was 07/21/2010.    PMH:  Usual childhood illnesses.  UTI 2007.  History of hepatitis B at age 29, but negative HBSAG this pregnancy.  History of infertility prior to 1st pregnancy.  Past Surgical History  Procedure Date  . Cesarean section     FHX:  Mother hypothyroid; MGM breast ca, MA cervical ca, sister smokes  Social History:  reports that she has never smoked. She does not have any smokeless tobacco history on file. She reports that she does not drink alcohol or use illicit drugs.  FOB is involved and supportive.  His name is Denee Boeder.  Patient is college-educated and employed in [employer], with her husband employed as an Art gallery manager, and college-educated. She denies a religious affiliation.  She is from [place], and has been followed by Dr. Estanislado Pandy  Allergies: No Known Allergies  Prenatal labs: Blood type A+ Rh antibody Negative RPR Non-reactive HBSAG negative HIV Non-reactive Rubella Non-reactive 1st trimester screen Normal AFP WNL 1hr. Glucola 148 3h GTT  WNL Hgb at NOB 11.9/10.7 at 28 weeks Pap WNL in July Cultures Negative at NOB GBS negative at 36 weeks.  Review of Systems  Constitutional: Negative.   HENT: Negative.   Eyes: Negative.   Respiratory: Negative.   Cardiovascular: Negative.   Gastrointestinal: Negative.   Genitourinary: Negative.   Musculoskeletal: Negative.   Skin: Negative.   Neurological: Negative.   Endo/Heme/Allergies: Negative.   Psychiatric/Behavioral: Negative.     Last menstrual period 07/21/2010. Physical Exam  Constitutional: She is oriented to person, place, and time. She appears well-developed and well-nourished.  HENT:  Head: Normocephalic.  Eyes: Pupils are equal, round, and reactive to light.  Neck: Normal range of motion.  Cardiovascular: Normal rate.   Respiratory: Effort normal and breath sounds normal.  GI: Soft.  Genitourinary: Uterus normal.  Musculoskeletal: Normal range of motion.  Neurological: She is alert and oriented to person, place, and time.  Skin: Skin is warm and dry.  Psychiatric: She has a normal mood and affect.  Uterus:  Fundal height 38-39 cm, soft and non-tender FHR 140s in office Pelvic deferred.  Assessment/Plan: Admit to [facility] for scheduled C/S and BTL per Dr. Estanislado Pandy Routine MD pre-op orders  Sheila Gervasi L 05/01/2011, 10:00 AM

## 2011-05-01 NOTE — H&P (View-Only) (Signed)
Tiffany Fuller is an 40 y.o. female G2P1 at 39 2/7 weeks, presenting for scheduled repeat C/S and BTL.  Pregnancy remarkable for: AMA Previous C/S with plan for repeat Desires BTL Hx infertility. GBS negative  OB History: G2, P1, with elective C/S in 2008 Had infertility prior to last pregnancy, but this pregnancy spontaneous +GBS previous pregnancy Had glycosuria with last pregnancy, but normal 3h GTT  History of pregnant pregnancy: Entered care at 11 weeks, with a plan for repeat C/S with Dr. Rivard.  Had normal 1st trimester screen and normal AFP.  Had EDC of 05/06/11 established by 1st trimester U/S. Had normal anatomy scan at 18 weeks, with anterior placenta.  Had elevated glucola of 148, with normal 3h GTT.  Had another U/S at 37 weeks, with EFW of 7+1, normal fluid. Decided on BTL during pregnancy.  GBS negative at 36 weeks.  Menstrual History: Menarche age: 15 Patient's last menstrual period was 07/21/2010.    PMH:  Usual childhood illnesses.  UTI 2007.  History of hepatitis B at age 11, but negative HBSAG this pregnancy.  History of infertility prior to 1st pregnancy.  Past Surgical History  Procedure Date  . Cesarean section     FHX:  Mother hypothyroid; MGM breast ca, MA cervical ca, sister smokes  Social History:  reports that she has never smoked. She does not have any smokeless tobacco history on file. She reports that she does not drink alcohol or use illicit drugs.  FOB is involved and supportive.  His name is Livia Pekar.  Patient is college-educated and employed in [employer], with her husband employed as an engineer, and college-educated. She denies a religious affiliation.  She is from [place], and has been followed by Dr. Rivard  Allergies: No Known Allergies  Prenatal labs: Blood type A+ Rh antibody Negative RPR Non-reactive HBSAG negative HIV Non-reactive Rubella Non-reactive 1st trimester screen Normal AFP WNL 1hr. Glucola 148 3h GTT  WNL Hgb at NOB 11.9/10.7 at 28 weeks Pap WNL in July Cultures Negative at NOB GBS negative at 36 weeks.  Review of Systems  Constitutional: Negative.   HENT: Negative.   Eyes: Negative.   Respiratory: Negative.   Cardiovascular: Negative.   Gastrointestinal: Negative.   Genitourinary: Negative.   Musculoskeletal: Negative.   Skin: Negative.   Neurological: Negative.   Endo/Heme/Allergies: Negative.   Psychiatric/Behavioral: Negative.     Last menstrual period 07/21/2010. Physical Exam  Constitutional: She is oriented to person, place, and time. She appears well-developed and well-nourished.  HENT:  Head: Normocephalic.  Eyes: Pupils are equal, round, and reactive to light.  Neck: Normal range of motion.  Cardiovascular: Normal rate.   Respiratory: Effort normal and breath sounds normal.  GI: Soft.  Genitourinary: Uterus normal.  Musculoskeletal: Normal range of motion.  Neurological: She is alert and oriented to person, place, and time.  Skin: Skin is warm and dry.  Psychiatric: She has a normal mood and affect.  Uterus:  Fundal height 38-39 cm, soft and non-tender FHR 140s in office Pelvic deferred.  Assessment/Plan: Ad[facility] to [facility] for scheduled C/S and BTL per Dr. Rivard Routine MD pre-op orders  Caitlynn Ju L 05/01/2011, 10:00 AM   

## 2011-05-01 NOTE — Op Note (Signed)
Preoperative diagnosis: Intrauterine pregnancy at 39 weeks and 2 days, desire for sterilization  Post operative diagnosis: Same  Anesthesia: Spinal  Anesthesiologist: Dr. Cristela Blue  Procedure: Primary low transverse cesarean section, bilateral tubal ligation  Surgeon: Dr. Dois Davenport Serinity Ware  Assistant: Nigel Bridgeman CNM  Estimated blood loss: 600 cc  Procedure:  After being informed of the planned procedure and possible complications including bleeding, infection, injury to other organs, informed consent is obtained. The patient is taken to OR #9 and given spinal anesthesia without complication. She is placed in the dorsal decubitus position with the pelvis tilted to the left. She is then prepped and draped in a sterile fashion. A Foley catheter is inserted in her bladder.  After assessing adequate level of anesthesia, we infiltrate the suprapubic area with 20 cc of Marcaine 0.25 and perform a Pfannenstiel incision which is brought down sharply to the fascia. The fascia is entered in a low transverse fashion. Linea alba is dissected and a few adhesions are dissected sharply freeing the omentum. Peritoneum is entered in a midline fashion. An Alexis retractor is easily positioned. Visceral peritoneum is entered in a low transverse fashion allowing [place] to safely retract bladder by developing a bladder flap.  The myometrium is then entered in a low transverse fashion; first with knife and then extended bluntly. The lower uterine segment was extremely thin allowing [place] to see baby. Amniotic fluid is clear. We assist the birth of a female  infant in vertex presentation. Mouth and nose are suctioned. The baby is delivered. The cord is clamped and sectioned. The baby is given to the neonatologist present in the room.  10 cc of blood is drawn from the umbilical vein.The placenta is allowed to deliver spontaneously. It is complete and the cord has 3 vessels. Uterine revision is negative.  We proceed with  closure of the myometrium in 2 layers: First with a running locked suture of 0 Vicryl, then with a Lembert suture of 0 Vicryl imbricating the first one. Hemostasis is completed with cauterization on peritoneal edges. 2 figure-of-eight stitches of 0 Vicryl complete hemostasis midline. One vessel on the bladder flap requires a 3-0 Vicryl figure-of-eight stitch for hemostasis.  Both paracolic gutters are cleaned. Both tubes and ovaries are assessed and normal. The pelvis is profusely irrigated with warm saline to confirm a satisfactory hemostasis.  The left tube is then isolated with 2 Babcock forcep. Mesosalpinx is entered with cauterization. We then doubly ligate the proximal stump and doubly ligate the distal stump with 0 chromic in the isthmal ampullary area. A 1.5 cm section of tube is been removed. Both stumps are then cauterized and hemostasis is adequate. A darkened nodule measuring 0.5 cm is noted at the tip of the left tube fimbriae. It is easily removed with cauterization and sent to pathology. We proceed in the exact same fashion for the right tube.  Retractors and sponges are removed. Under fascia hemostasis is completed with cauterization. The fascia is then closed with 2 running sutures of 0 Vicryl meeting midline. The wound is irrigated with warm saline and hemostasis is completed with cauterization. The skin is closed with a subcuticular suture of 3-0 Monocryl and Steri-Strips.  Instrument and sponge count is complete x2. Estimated blood loss is 600 cc.  The procedure is well tolerated by the patient who is taken to recovery room in a well and stable condition.  female baby named unknown at this time was born at 13:12 and received an Apgar of 28  at 1 minute and 9 at 5 minutes. Weight was 6 lbs  10 oz.    Specimen: Placenta sent to L & D, 2 segments of tube sent to pathology.   BJYNWG,NFAOZH A MD 8/21/20122:09 PM

## 2011-05-01 NOTE — Transfer of Care (Signed)
Immediate Anesthesia Transfer of Care Note  Patient: Tiffany Fuller  Procedure(s) Performed:  CESAREAN SECTION WITH BILATERAL TUBAL LIGATION  Patient Location: PACU  Anesthesia Type: Spinal  Level of Consciousness: awake, alert  and oriented  Airway & Oxygen Therapy: Patient Spontanous Breathing  Post-op Assessment: Report given to PACU RN  Post vital signs: stable  Complications: No apparent anesthesia complications

## 2011-05-01 NOTE — Brief Op Note (Signed)
  Cesarean Section Procedure Note   Tiffany Fuller  05/01/2011  Indications: Scheduled Proceedure/Maternal Request   Pre-operative Diagnosis: Previous Cesarian Sectioon; Desires Sterilization.   Post-operative Diagnosis: Same   Procedure: Low transverse cesarean section  Surgeon: Surgeon(s) and Role:    * Esmeralda Arthur, MD - Primary   Assistants: Nigel Bridgeman CNM  Anesthesia: spinal   Baby: Live female infant born at  13:12   named Tiffany Fuller at this time     weight 7lbs10oz  APGAR 9 at 1 minute  9 at 5 minutes  Estimated Blood Loss: * No blood loss amount entered *   Specimens: placenta sent to L&D  Complications: no complications  Maternal Condition: stable   Baby condition / location:  nursery-stable  Signed: Surgeon(s): Esmeralda Arthur, MD

## 2011-05-01 NOTE — Anesthesia Procedure Notes (Addendum)
Spinal Block  Patient location during procedure: OR Staffing Anesthesiologist: JACKSON, KYLE EDWARD Preanesthetic Checklist Completed: patient identified, site marked, surgical consent, pre-op evaluation, timeout performed, IV checked, risks and benefits discussed and monitors and equipment checked Spinal Block Patient position: sitting Prep: DuraPrep Patient monitoring: heart rate, cardiac monitor, continuous pulse ox and blood pressure Approach: midline Location: L3-4 Injection technique: single-shot Needle Needle type: Sprotte  Needle gauge: 24 G Needle length: 9 cm Assessment Sensory level: T4 Additional Notes Spinal Dosage in OR  Bupivicaine ml       1.5 PFMS04   mcg        150 Fentanyl mcg            20    

## 2011-05-01 NOTE — Anesthesia Postprocedure Evaluation (Signed)
  Anesthesia Post-op Note  Patient: Tiffany Fuller  Procedure(s) Performed:  CESAREAN SECTION WITH BILATERAL TUBAL LIGATION  Patient Location: PACU  Anesthesia Type: Spinal  Level of Consciousness: awake, alert  and oriented  Airway and Oxygen Therapy: Patient Spontanous Breathing  Post-op Pain: none  Post-op Assessment: Post-op Vital signs reviewed, Patient's Cardiovascular Status Stable, Respiratory Function Stable, Patent Airway, No signs of Nausea or vomiting, Pain level controlled, No headache, No backache, No residual numbness and No residual motor weakness  Post-op Vital Signs: Reviewed and stable  Complications: No apparent anesthesia complications

## 2011-05-02 LAB — CBC
Platelets: 150 10*3/uL (ref 150–400)
RBC: 3.83 MIL/uL — ABNORMAL LOW (ref 3.87–5.11)
RDW: 14.2 % (ref 11.5–15.5)
WBC: 9.4 10*3/uL (ref 4.0–10.5)

## 2011-05-02 MED ORDER — LACTATED RINGERS IV SOLN
INTRAVENOUS | Status: DC
  Administered 2011-05-02: via INTRAVENOUS

## 2011-05-02 NOTE — Progress Notes (Addendum)
Subjective: Postpartum Day 1: Cesarean Delivery with BTL. Patient reports no problems voiding.  Doing well.  Breastfeeding going well.  Objective: Vital signs in last 24 hours: Temp:  [97.4 F (36.3 C)-98.9 F (37.2 C)] 98.9 F (37.2 C) (08/22 0830) Pulse Rate:  [59-89] 75  (08/22 0830) Resp:  [15-20] 15  (08/22 0830) BP: (87-133)/(54-77) 87/54 mmHg (08/22 0830) SpO2:  [95 %-100 %] 97 % (08/22 0830) Weight:  [75.751 kg (167 lb)] 167 lb (75.751 kg) (08/21 1530)  Physical Exam:  General: alert Lochia: appropriate Uterine Fundus: firm Incision: healing well.  Dressing CDI DVT Evaluation: No evidence of DVT seen on physical exam.   Basename 05/02/11 0515  HGB 10.8*  HCT 33.0*    Assessment/Plan: Status post Cesarean section. Doing well postoperatively.  Continue current care. Patient may desire d/c home tomorrow.  LATHAM,VICKI L 05/02/2011, 8:51 AM   Agree with above - AYR

## 2011-05-03 ENCOUNTER — Encounter (HOSPITAL_COMMUNITY)
Admission: RE | Admit: 2011-05-03 | Discharge: 2011-05-03 | Disposition: A | Discharge status: Home / Self Care | Payer: BC Managed Care – PPO | Source: Ambulatory Visit | Attending: Obstetrics and Gynecology | Admitting: Obstetrics and Gynecology

## 2011-05-03 DIAGNOSIS — O923 Agalactia: Secondary | ICD-10-CM | POA: Insufficient documentation

## 2011-05-03 MED ORDER — IBUPROFEN 600 MG PO TABS: 600.0000 mg | ORAL_TABLET | Freq: Four times a day (QID) | ORAL | Status: AC | PRN

## 2011-05-03 MED ORDER — OXYCODONE-ACETAMINOPHEN 5-325 MG PO TABS: 1.0000 | ORAL_TABLET | ORAL | Status: AC | PRN

## 2011-05-03 NOTE — Progress Notes (Signed)
  Subjective: Postpartum Day 2: Cesarean Delivery Patient reports that pain is well-managed.Lochia normal.  Ambulating, voiding, tolerating diet as ordered without difficulty. Normal flatus. Normal bowel movement. Breastfeeding going well  Objective: Vital signs in last 24 hours: Temp:  [98 F (36.7 C)-98.7 F (37.1 C)] 98.2 F (36.8 C) (08/22 2212) Pulse Rate:  [70-93] 93  (08/22 2212) Resp:  [16-18] 18  (08/22 2212) BP: (100-129)/(64-82) 129/82 mmHg (08/22 2212) SpO2:  [94 %] 94 % (08/22 1358)  Physical Exam:  General: alert Lochia: appropriate Uterine Fundus: firm and appropriately tender Incision: healing well DVT Evaluation: No evidence of DVT seen on physical exam. Edema minimal   Basename 05/02/11 0515  HGB 10.8*  HCT 33.0*    Assessment/Plan: Status post Cesarean section. Doing well postoperatively.  Continue current care. Discharge home today Discharge instructions reviewed with patient and husband  Adeoluwa Silvers A 05/03/2011, 11:23 AM

## 2011-05-03 NOTE — Discharge Summary (Addendum)
  Obstetric Discharge Summary  Reason for Admission: cesarean section Prenatal Procedures: none and amniocentesis Intrapartum Procedures: cesarean: low cervical, transverse and tubal ligation by Silverio Lay MD Postpartum Procedures: none Complications-Operative and Postpartum: none  Hemoglobin  Date Value Range Status  05/02/2011 10.8* 12.0-15.0 (g/dL) Final     HCT  Date Value Range Status  05/02/2011 33.0* 36.0-46.0 (%) Final    Discharge Diagnoses: Term Pregnancy-delivered  Discharge Information:  Date: 05/03/2011 Activity: unrestricted Diet: routine Medications: Ibuprophen and Percocet Condition: stable  Breastfeeding: yes  Instructions: refer to practice specific booklet and post-operative discharge instructions Discharge to: home   Newborn Data: Live born  Information for the patient's newborn:  Caitlynn, Ju [[id number]]  female Named Bennie Hind ; APGAR 9, 9    weight ; 7 lbs 10 oz Home with mother.  Haivyn Oravec A MD 05/03/2011, 11:25 AM

## 2011-06-03 ENCOUNTER — Encounter (HOSPITAL_COMMUNITY)
Admission: RE | Admit: 2011-06-03 | Discharge: 2011-06-03 | Disposition: A | Discharge status: Home / Self Care | Payer: BC Managed Care – PPO | Source: Ambulatory Visit | Attending: Obstetrics and Gynecology | Admitting: Obstetrics and Gynecology

## 2011-06-03 DIAGNOSIS — O923 Agalactia: Secondary | ICD-10-CM | POA: Insufficient documentation

## 2011-06-19 LAB — URINALYSIS, ROUTINE W REFLEX MICROSCOPIC
Glucose, UA: NEGATIVE
Protein, ur: NEGATIVE
Specific Gravity, Urine: >1.03 — ABNORMAL HIGH
pH: 5.5

## 2011-06-19 LAB — CBC
HCT: 37.5
HCT: 35.4 — ABNORMAL LOW
Hemoglobin: 10.3 — ABNORMAL LOW
MCV: 86.4
Platelets: 341
Platelets: 211
RBC: 3.36 — ABNORMAL LOW
RBC: 4.32
RBC: 4.09
WBC: 8.4
WBC: 9.9
WBC: 7

## 2011-06-19 LAB — DIFFERENTIAL
Eosinophils Relative: 5
Lymphocytes Relative: 20
Lymphs Abs: 1.7

## 2011-06-25 LAB — KLEIHAUER-BETKE STAIN: Quantitation Fetal Hemoglobin: 0

## 2011-06-25 LAB — CBC
Hemoglobin: 10.8 — ABNORMAL LOW
Platelets: 263
RDW: 13.7
WBC: 10.2

## 2011-06-25 LAB — URINALYSIS, ROUTINE W REFLEX MICROSCOPIC
Ketones, ur: 15 — AB
Nitrite: NEGATIVE
Protein, ur: NEGATIVE
Urobilinogen, UA: 0.2

## 2011-06-25 LAB — FETAL FIBRONECTIN: Fetal Fibronectin: NEGATIVE

## 2011-06-25 LAB — STREP B DNA PROBE: Strep Group B Ag: POSITIVE

## 2011-06-26 ENCOUNTER — Encounter (HOSPITAL_COMMUNITY): Payer: Self-pay | Admitting: *Deleted

## 2011-07-04 ENCOUNTER — Encounter (HOSPITAL_COMMUNITY)
Admission: RE | Admit: 2011-07-04 | Discharge: 2011-07-04 | Disposition: A | Discharge status: Home / Self Care | Payer: BC Managed Care – PPO | Source: Ambulatory Visit | Attending: Obstetrics and Gynecology | Admitting: Obstetrics and Gynecology

## 2011-07-04 DIAGNOSIS — O923 Agalactia: Secondary | ICD-10-CM | POA: Insufficient documentation

## 2011-08-04 ENCOUNTER — Encounter (HOSPITAL_COMMUNITY)
Admission: RE | Admit: 2011-08-04 | Discharge: 2011-08-04 | Disposition: A | Discharge status: Home / Self Care | Payer: BC Managed Care – PPO | Source: Ambulatory Visit | Attending: Obstetrics and Gynecology | Admitting: Obstetrics and Gynecology

## 2011-08-04 DIAGNOSIS — O923 Agalactia: Secondary | ICD-10-CM | POA: Insufficient documentation

## 2011-09-04 ENCOUNTER — Encounter (HOSPITAL_COMMUNITY)
Admission: RE | Admit: 2011-09-04 | Discharge: 2011-09-04 | Disposition: A | Discharge status: Home / Self Care | Payer: BC Managed Care – PPO | Source: Ambulatory Visit | Attending: Obstetrics and Gynecology | Admitting: Obstetrics and Gynecology

## 2011-09-04 DIAGNOSIS — O923 Agalactia: Secondary | ICD-10-CM | POA: Insufficient documentation

## 2011-10-05 ENCOUNTER — Encounter (HOSPITAL_COMMUNITY)
Admission: RE | Admit: 2011-10-05 | Discharge: 2011-10-05 | Disposition: A | Discharge status: Home / Self Care | Payer: BC Managed Care – PPO | Source: Ambulatory Visit | Attending: Obstetrics and Gynecology | Admitting: Obstetrics and Gynecology

## 2011-10-05 DIAGNOSIS — O923 Agalactia: Secondary | ICD-10-CM | POA: Insufficient documentation

## 2011-11-05 ENCOUNTER — Encounter (HOSPITAL_COMMUNITY)
Admission: RE | Admit: 2011-11-05 | Discharge: 2011-11-05 | Disposition: A | Discharge status: Home / Self Care | Payer: BC Managed Care – PPO | Source: Ambulatory Visit | Attending: Obstetrics and Gynecology | Admitting: Obstetrics and Gynecology

## 2011-11-05 DIAGNOSIS — O923 Agalactia: Secondary | ICD-10-CM | POA: Insufficient documentation

## 2011-12-05 ENCOUNTER — Encounter (HOSPITAL_COMMUNITY)
Admission: RE | Admit: 2011-12-05 | Discharge: 2011-12-05 | Disposition: A | Discharge status: Home / Self Care | Payer: BC Managed Care – PPO | Source: Ambulatory Visit | Attending: Obstetrics and Gynecology | Admitting: Obstetrics and Gynecology

## 2011-12-05 DIAGNOSIS — O923 Agalactia: Secondary | ICD-10-CM | POA: Insufficient documentation

## 2012-06-26 ENCOUNTER — Ambulatory Visit (INDEPENDENT_AMBULATORY_CARE_PROVIDER_SITE_OTHER): Payer: BC Managed Care – PPO | Admitting: Obstetrics and Gynecology

## 2012-06-26 ENCOUNTER — Encounter: Payer: Self-pay | Admitting: Obstetrics and Gynecology

## 2012-06-26 VITALS — BP 100/62 | Ht 64.0 in | Wt 147.0 lb

## 2012-06-26 DIAGNOSIS — Z124 Encounter for screening for malignant neoplasm of cervix: Secondary | ICD-10-CM

## 2012-06-26 DIAGNOSIS — Z8349 Family history of other endocrine, nutritional and metabolic diseases: Secondary | ICD-10-CM

## 2012-06-26 DIAGNOSIS — N949 Unspecified condition associated with female genital organs and menstrual cycle: Secondary | ICD-10-CM

## 2012-06-26 DIAGNOSIS — Z01419 Encounter for gynecological examination (general) (routine) without abnormal findings: Secondary | ICD-10-CM

## 2012-06-26 DIAGNOSIS — N938 Other specified abnormal uterine and vaginal bleeding: Secondary | ICD-10-CM

## 2012-06-26 NOTE — Progress Notes (Signed)
The patient reports:normal menses, no abnormal bleeding, pelvic pain or discharge  Contraception:bilateral tubal ligation  Last mammogram: was normal  2012 Last pap: was normal October  2012  GC/Chlamydia cultures offered: declined HIV/RPR/HbsAg offered:  declined HSV 1 and 2 glycoprotein offered: declined  Menstrual cycle regular and monthly: No: pt stated cycles have been coming closer  Menstrual flow normal: Yes  Urinary symptoms: none Normal bowel movements: Yes Reports abuse at home: No:  Pt  Stated been having cycles closer to another cycles and bleeding for about 8-9 days.  Subjective:    Tiffany Fuller is a 41 y.o. female, [id number], who presents for an annual exam.     History   Social History  . Marital Status: Married    Spouse Name: N/A    Number of Children: N/A  . Years of Education: N/A   Social History Main Topics  . Smoking status: Never Smoker   . Smokeless tobacco: Never Used  . Alcohol Use: No  . Drug Use: No  . Sexually Active: Yes    Birth Control/ Protection: Surgical     BTL    Other Topics Concern  . None   Social History Narrative  . None    Menstrual cycle:   LMP: Patient's last menstrual period was 06/17/2012.           Cycle: is irregular. D/C BF 10/2011: amenorrhea for 3 months then 2 normal cycles followed by 50+ days and now last 3 cycles were every 20-23 days with normal flow x 3 days then spotting for 4-7 days.  The following portions of the patient's history were reviewed and updated as appropriate: allergies, current medications, past family history, past medical history, past social history, past surgical history and problem list.  Review of Systems Pertinent items are noted in HPI. Breast:Negative for breast lump,nipple discharge or nipple retraction Gastrointestinal: Negative for abdominal pain, change in bowel habits or rectal bleeding Urinary:negative   Objective:    BP 100/62  Ht 5\' 4"  (1.626 m)  Wt 147 lb (66.679  kg)  BMI 25.23 kg/m2  LMP 06/17/2012  Breastfeeding? No    Weight:  Wt Readings from Last 1 Encounters:  06/26/12 147 lb (66.679 kg)          BMI: Body mass index is 25.23 kg/(m^2).  General Appearance: Alert, appropriate appearance for age. No acute distress HEENT: Grossly normal Neck / Thyroid: Supple, no masses, nodes or enlargement Lungs: clear to auscultation bilaterally Back: No CVA tenderness Breast Exam: No masses or nodes.No dimpling, nipple retraction or discharge. Cardiovascular: Regular rate and rhythm. S1, S2, no murmur Gastrointestinal: Soft, non-tender, no masses or organomegaly Pelvic Exam: Vulva and vagina appear normal. Bimanual exam reveals normal uterus and adnexa. Rectovaginal: normal rectal, no masses Lymphatic Exam: Non-palpable nodes in neck, clavicular, axillary, or inguinal regions Skin: no rash or abnormalities Neurologic: Normal gait and speech, no tremor  Psychiatric: Alert and oriented, appropriate affect.    Assessment:    Normal gyn exam  DUB afte BF was discontinued   Plan:    Thyroid panel and Prolactin Observe cycle for 3-4 months: if remains irreg will schedule sono +/- cyclic provera mammogram pap smear return annually or prn STD screening: declined Contraception:bilateral tubal ligation   Silverio Lay MD

## 2012-06-27 LAB — THYROID PANEL WITH TSH
Free Thyroxine Index: 4.1 — ABNORMAL HIGH (ref 1.0–3.9)
TSH: 2.241 u[IU]/mL (ref 0.350–4.500)

## 2012-06-27 LAB — PROLACTIN: Prolactin: 6.8 ng/mL

## 2012-08-26 ENCOUNTER — Telehealth: Payer: Self-pay | Admitting: Obstetrics and Gynecology

## 2012-08-26 DIAGNOSIS — N912 Amenorrhea, unspecified: Secondary | ICD-10-CM

## 2012-08-26 NOTE — Telephone Encounter (Signed)
sr pt 

## 2012-08-27 NOTE — Telephone Encounter (Signed)
Per last visit, need follow-up with ultrasound. Before appointment will need a trial of Provera 10 mg po daily for 10 days

## 2012-08-28 MED ORDER — MEDROXYPROGESTERONE ACETATE 10 MG PO TABS: ORAL_TABLET | ORAL | Status: DC

## 2012-08-28 NOTE — Telephone Encounter (Signed)
PROVERA SENT TO PHARMACY AND APT SCHEDULED FOR U/S BEFORE VISIT WITH SR ON 09/24/12

## 2012-09-23 ENCOUNTER — Other Ambulatory Visit: Payer: Self-pay | Admitting: Obstetrics and Gynecology

## 2012-09-23 DIAGNOSIS — N912 Amenorrhea, unspecified: Secondary | ICD-10-CM

## 2012-09-24 ENCOUNTER — Ambulatory Visit: Payer: BC Managed Care – PPO

## 2012-09-24 ENCOUNTER — Ambulatory Visit: Payer: BC Managed Care – PPO | Admitting: Obstetrics and Gynecology

## 2012-09-24 ENCOUNTER — Encounter: Payer: Self-pay | Admitting: Obstetrics and Gynecology

## 2012-09-24 VITALS — BP 96/58 | Ht 64.0 in | Wt 151.0 lb

## 2012-09-24 DIAGNOSIS — N926 Irregular menstruation, unspecified: Secondary | ICD-10-CM

## 2012-09-24 DIAGNOSIS — N912 Amenorrhea, unspecified: Secondary | ICD-10-CM

## 2012-09-24 LAB — POCT URINE PREGNANCY: Preg Test, Ur: NEGATIVE

## 2012-09-24 MED ORDER — MEDROXYPROGESTERONE ACETATE 10 MG PO TABS: ORAL_TABLET | ORAL | Status: DC

## 2012-09-24 NOTE — Progress Notes (Deleted)
Subjective:     Patient ID: Tiffany Fuller, female   DOB: 1971/07/13, 42 y.o.   MRN: [id number]  HPI   Review of Systems     Objective:   Physical Exam     Assessment:     ***    Plan:     ***

## 2012-09-24 NOTE — Progress Notes (Signed)
  Current contraception: BTL Hormone replacement therapy: No New medication: No  History of BJY:NWGN  History of infertility: no. History of abnormal Pap smear: no History of fibroids: No  Increased stress: No  Abnormal bleeding pattern started: Pt states has missed different cycles since spring of 2013 since stopped breastfeeding. Thyroid panel and Prolactin normal 06/2012   Subjective:    Tiffany Fuller is a 42 y.o. female, G2P2002, who presents for Gyn ultrasound because of  Amenorrhea  The following portions of the patient's history were reviewed and updated as appropriate: allergies, current medications, past family history.  Objective:    BP 96/58  Ht 5\' 4"  (1.626 m)  Wt 151 lb (68.493 kg)  BMI 25.92 kg/m2  LMP 09/01/2012  Breastfeeding? No    Weight:  Wt Readings from Last 1 Encounters:  09/24/12 151 lb (68.493 kg)          BMI: Body mass index is 25.92 kg/(m^2).  ULTRASOUND: Uterus AV    Adnexa WNL    Endometrium 885 mm    Free fluid: no    Other findings:  Dominant follicle on LTOV. c section scar noted.  Patient had normal withdrawal bleeding with Provera challenge   Assessment:    Dysfunctional uterine bleeding  likely anovulatory   Plan:    Provera every 60 days PRN Follow-up AEX  Silverio Lay MD

## 2012-10-25 ENCOUNTER — Other Ambulatory Visit: Payer: Self-pay

## 2013-07-16 ENCOUNTER — Other Ambulatory Visit: Payer: Self-pay

## 2014-07-12 ENCOUNTER — Encounter: Payer: Self-pay | Admitting: Obstetrics and Gynecology

## 2021-11-13 ENCOUNTER — Encounter: Payer: Self-pay | Admitting: Gastroenterology

## 2021-11-13 ENCOUNTER — Telehealth: Payer: Self-pay | Admitting: Gastroenterology

## 2021-11-13 NOTE — Telephone Encounter (Signed)
error 

## 2021-12-06 ENCOUNTER — Encounter: Payer: Self-pay | Admitting: Gastroenterology

## 2021-12-06 ENCOUNTER — Other Ambulatory Visit (INDEPENDENT_AMBULATORY_CARE_PROVIDER_SITE_OTHER): Payer: BC Managed Care – PPO

## 2021-12-06 ENCOUNTER — Ambulatory Visit: Payer: BC Managed Care – PPO | Admitting: Gastroenterology

## 2021-12-06 VITALS — BP 100/60 | HR 83 | Ht 64.0 in | Wt 144.0 lb

## 2021-12-06 DIAGNOSIS — R748 Abnormal levels of other serum enzymes: Secondary | ICD-10-CM

## 2021-12-06 DIAGNOSIS — R1012 Left upper quadrant pain: Secondary | ICD-10-CM

## 2021-12-06 LAB — HEPATIC FUNCTION PANEL
ALT: 35 U/L (ref 0–35)
AST: 24 U/L (ref 0–37)
Albumin: 4.6 g/dL (ref 3.5–5.2)
Alkaline Phosphatase: 89 U/L (ref 39–117)
Bilirubin, Direct: 0.1 mg/dL (ref 0.0–0.3)
Total Bilirubin: 0.5 mg/dL (ref 0.2–1.2)
Total Protein: 7.8 g/dL (ref 6.0–8.3)

## 2021-12-06 NOTE — Progress Notes (Signed)
? ?Referring Provider: Kathyrn Lass, MD ?Primary Care Physician:  Gates Rigg, MD ? ? ?Reason for Consultation: Pain, abnormal liver enzymes ? ? ?IMPRESSION:  ?Near constant left upper abdominal pain without alarm features ?Abnormal transaminases ?   - AST 44, ALT 66 2022 ?   - AST 24, ALT 35 ?   - not thought to be related to abdominal pain ?Normal colonoscopy with Dr. Collene Mares October 2021 ? ?PLAN: ?- Hepatic function panel, pursue additional evaluation if transaminases remain elevated ?- CT ab/pelvis contrast to evaluate the LUQ abdominal pain ?- EGD with biopsies to evaluate the LUQ abdominal pain ?- Obtain records from Dr. Collene Mares including colonoscopy and prior office visits ?- Empiric trial of dicyclomine declined ? ? ?HPI: Tiffany Fuller is a 51 y.o. female self-referred for further evaluation of pain and abnormal liver enzymes.  The history is obtained through the patient and review of her electronic health record. She works in Programmer, applications at [employer].   ? ?She reports intermittent left upper abdominal pain over the last 1-2 years. Pain is located under her left rib and intermittently radiates around to the back. It feels like "something is inflamed."  It is there constantly as long as she thinks about. When distracted, she is not aware of the pain. Reports some bloating with foods that she treats with charcoal. No diarrhea, constipation, or change in bowel habits.  ? ?Colonoscopy with Dr. Collene Mares in October 2021 was normal per the patient report.  These symptoms were present at that time of her colonoscopy. No upper endoscopy performed.   ? ?Has not treated any specific medications to treat the symptoms.  ? ?Rare use of Aleve for headache or Tylenol for fever.  ? ?She has been researching online and wonders if it's her stomach or her pancreas.  ? ?She has a history of elevated liver enzymes.   ?Labs from 01/25/2021 show an AST of 44, ALT 66, alk phos 136, total bilirubin 0.6.  ?Labs 12/06/21 show AST 24, ALT 35,  alk phos 89, total bilirubin 0.5, albumin 4.6  ? ?Testing in 2021 was negative for chronic hepatitis B and chronic hepatitis C. ?She has had no abdominal imaging.  ? ?Father died of liver cancer. There is no known family history of colon cancer or polyps. No family history of stomach cancer or other GI malignancy. No family history of inflammatory bowel disease or celiac.  ? ? ?Past Medical History:  ?Diagnosis Date  ? Hepatitis A   ? as a child  ? ? ?Past Surgical History:  ?Procedure Laterality Date  ? CESAREAN SECTION    ? x 2  ? TUBAL LIGATION    ? ? ?Current Outpatient Medications  ?Medication Sig Dispense Refill  ? MULTIPLE VITAMIN PO Take by mouth daily.    ? ?No current facility-administered medications for this visit.  ? ? ?Allergies as of 12/06/2021  ? (No Known Allergies)  ? ? ?Family History  ?Problem Relation Age of Onset  ? Thyroid disease Mother   ? Liver cancer Father   ? Breast cancer Maternal Grandmother   ? Ovarian cancer Maternal Aunt   ? Breast cancer Maternal Aunt   ? Colon cancer Neg Hx   ? Esophageal cancer Neg Hx   ? ? ?Social History  ? ?Socioeconomic History  ? Marital status: Married  ?  Spouse name: Not on file  ? Number of children: 2  ? Years of education: Not on file  ? Highest education  level: Not on file  ?Occupational History  ? Not on file  ?Tobacco Use  ? Smoking status: Never  ? Smokeless tobacco: Never  ?Vaping Use  ? Vaping Use: Never used  ?Substance and Sexual Activity  ? Alcohol use: No  ? Drug use: No  ? Sexual activity: Yes  ?  Birth control/protection: Surgical  ?  Comment: BTL   ?Other Topics Concern  ? Not on file  ?Social History Narrative  ? Not on file  ? ?Social Determinants of Health  ? ?Financial Resource Strain: Not on file  ?Food Insecurity: Not on file  ?Transportation Needs: Not on file  ?Physical Activity: Not on file  ?Stress: Not on file  ?Social Connections: Not on file  ?Intimate Partner Violence: Not on file  ? ? ?Review of Systems: ?12 system ROS is  negative except as noted above.  ? ?Physical Exam: ?General:   Alert,  well-nourished, pleasant and cooperative in NAD ?Head:  Normocephalic and atraumatic. ?Eyes:  Sclera clear, no icterus.   Conjunctiva pink. ?Ears:  Normal auditory acuity. ?Nose:  No deformity, discharge,  or lesions. ?Mouth:  No deformity or lesions.   ?Neck:  Supple; no masses or thyromegaly. ?Lungs:  Clear throughout to auscultation.   No wheezes. ?Heart:  Regular rate and rhythm; no murmurs. ?Abdomen:  Soft, nontender, nondistended, normal bowel sounds, no rebound or guarding. No hepatosplenomegaly.   ?Rectal:  Deferred  ?Msk:  Symmetrical. No boney deformities ?LAD: No inguinal or umbilical LAD ?Extremities:  No clubbing or edema. ?Neurologic:  Alert and  oriented x4;  grossly nonfocal ?Skin:  Intact without significant lesions or rashes. ?Psych:  Alert and cooperative. Normal mood and affect. ? ? ? ?Zerina Hallinan L. Tarri Glenn, MD, MPH ?12/07/2021, 8:38 PM ? ? ? ?  ?

## 2021-12-06 NOTE — Patient Instructions (Signed)
It was my pleasure to provide care to you today. Based on our discussion, I am providing you with my recommendations below: ? ?RECOMMENDATION(S):  ? ?LABS:  ? ?Please proceed to the basement level for lab work before leaving today. Press "B" on the elevator. The lab is located at the first door on the left as you exit the elevator. ? ?IMAGING: ? ?You have been scheduled for a CT scan of the abdomen and pelvis at [facility] ([address]---this is in the same building as [employer]).  ? ?You are scheduled on Thursday 12/14/21 at 2:30 pm. You should arrive 15 minutes prior to your appointment time for registration. Please follow the written instructions below on the day of your exam: ? ?WARNING: IF YOU ARE ALLERGIC TO IODINE/X-RAY DYE, PLEASE NOTIFY RADIOLOGY IMMEDIATELY AT [phone]! YOU WILL BE GIVEN A 13 HOUR PREMEDICATION PREP. ? ?1) Do not eat or drink anything after 10:30 am (4 hours prior to your test) ?2) You have been given 2 bottles of oral contrast to drink. The solution may taste better if refrigerated, but do NOT add ice or any other liquid to this solution. Shake well before drinking. ?  ? Drink 1 bottle of contrast @ 12:30 pm (2 hours prior to your exam) ? Drink 1 bottle of contrast @ 1:30 pm (1 hour prior to your exam) ? ?You may take any medications as prescribed with a small amount of water, if necessary. If you take any of the following medications: METFORMIN, GLUCOPHAGE, GLUCOVANCE, AVANDAMET, RIOMET, FORTAMET, ACTOPLUS MET, JANUMET, [place] or METAGLIP, you MAY be asked to HOLD this medication 48 hours AFTER the exam. ? ?The purpose of you drinking the oral contrast is to aid in the visualization of your intestinal tract. The contrast solution may cause some diarrhea. Depending on your individual set of symptoms, you may also receive an intravenous injection of x-ray contrast/dye. Plan on being at [facility] for 30 minutes or longer, depending on the type  of exam you are having performed. ? ?This test typically takes 30-45 minutes to complete. ? ?If you have any questions regarding your exam or if you need to reschedule, you may call the CT department at [phone] between the hours of 8:00 am and 5:00 pm, Monday-Friday. ? ?___________________________________________________________________ ? ? ?ENDOSCOPY:  ? ?You have been scheduled for an endoscopy. Please follow written instructions given to you at your visit today. ? ?INHALERS:  ? ?If you use inhalers (even only as needed), please bring them with you on the day of your procedure. ? ?FOLLOW UP: ? ?After your procedure, you will receive a call from my office staff regarding my recommendation for follow up. ? ?BMI: ? ?If you are age 50 or younger, your body mass index should be between 19-25. Your Body mass index is 24.72 kg/m?Marland Kitchen If this is out of the aformentioned range listed, please consider follow up with your Primary Care Provider.  ? ?MY CHART: ? ?The [facility] providers would like to encourage you to use [facility] to communicate with providers for non-urgent requests or questions.  Due to long hold times on the telephone, sending your provider a message by [facility] may be a faster and more efficient way to get a response.  Please allow 48 business hours for a response.  Please remember that this is for non-urgent requests.  ? ?Thank you for trusting me with your gastrointestinal care!   ? ?Thornton Park, MD, MPH ? ?

## 2021-12-07 ENCOUNTER — Encounter: Payer: Self-pay | Admitting: Gastroenterology

## 2021-12-14 ENCOUNTER — Ambulatory Visit (INDEPENDENT_AMBULATORY_CARE_PROVIDER_SITE_OTHER)
Admission: RE | Admit: 2021-12-14 | Discharge: 2021-12-14 | Disposition: A | Discharge status: Home / Self Care | Payer: BC Managed Care – PPO | Source: Ambulatory Visit | Attending: Gastroenterology | Admitting: Gastroenterology

## 2021-12-14 DIAGNOSIS — R1012 Left upper quadrant pain: Secondary | ICD-10-CM

## 2021-12-14 DIAGNOSIS — R748 Abnormal levels of other serum enzymes: Secondary | ICD-10-CM

## 2021-12-14 MED ORDER — IOHEXOL 300 MG/ML  SOLN
100.0000 mL | Freq: Once | INTRAMUSCULAR | Status: AC | PRN
  Administered 2021-12-14: 100 mL via INTRAVENOUS

## 2022-01-16 ENCOUNTER — Ambulatory Visit (AMBULATORY_SURGERY_CENTER): Payer: BC Managed Care – PPO | Admitting: Gastroenterology

## 2022-01-16 ENCOUNTER — Encounter: Payer: Self-pay | Admitting: Gastroenterology

## 2022-01-16 VITALS — BP 106/72 | HR 72 | Temp 97.7°F | Resp 15 | Ht 64.0 in | Wt 144.0 lb

## 2022-01-16 DIAGNOSIS — R1012 Left upper quadrant pain: Secondary | ICD-10-CM

## 2022-01-16 DIAGNOSIS — K219 Gastro-esophageal reflux disease without esophagitis: Secondary | ICD-10-CM

## 2022-01-16 DIAGNOSIS — K3189 Other diseases of stomach and duodenum: Secondary | ICD-10-CM | POA: Diagnosis not present

## 2022-01-16 DIAGNOSIS — K2289 Other specified disease of esophagus: Secondary | ICD-10-CM | POA: Diagnosis not present

## 2022-01-16 MED ORDER — SODIUM CHLORIDE 0.9 % IV SOLN: 500.0000 mL | Freq: Once | INTRAVENOUS | Status: DC

## 2022-01-16 NOTE — Progress Notes (Signed)
Vitals-DT  Pt's states no medical or surgical changes since previsit or office visit.  

## 2022-01-16 NOTE — Op Note (Signed)
[facility] ?Patient Name: Tiffany Fuller ?Procedure Date: 01/16/2022 10:17 AM ?MRN: [id number] ?Endoscopist: Thornton Park MD, MD ?Age: 51 ?Referring MD:  ?Date of Birth: 10-05-1970 ?Gender: Female ?Account #: [id number] ?Procedure:                Upper GI endoscopy ?Indications:              Abdominal pain in the left upper quadrant ?Medicines:                Monitored Anesthesia Care ?Procedure:                Pre-Anesthesia Assessment: ?                          - Prior to the procedure, a History and Physical  ?                          was performed, and patient medications and  ?                          allergies were reviewed. The patient's tolerance of  ?                          previous anesthesia was also reviewed. The risks  ?                          and benefits of the procedure and the sedation  ?                          options and risks were discussed with the patient.  ?                          All questions were answered, and informed consent  ?                          was obtained. Prior Anticoagulants: The patient has  ?                          taken no previous anticoagulant or antiplatelet  ?                          agents. ASA Grade Assessment: II - A patient with  ?                          mild systemic disease. After reviewing the risks  ?                          and benefits, the patient was deemed in  ?                          satisfactory condition to undergo the procedure. ?                          After obtaining informed consent, the endoscope was  ?  passed under direct vision. Throughout the  ?                          procedure, the patient's blood pressure, pulse, and  ?                          oxygen saturations were monitored continuously. The  ?                          Endoscope was introduced through the mouth, and  ?                          advanced to the third part of duodenum. The upper  ?                          GI  endoscopy was accomplished without difficulty.  ?                          The patient tolerated the procedure well. ?Scope In: ?Scope Out: ?Findings:                 The examined esophagus was normal. Biopsies were  ?                          taken from the distal esophagus with a cold forceps  ?                          for histology. Estimated blood loss was minimal. ?                          The entire examined stomach was normal. Biopsies  ?                          were taken from the antrum, body, and fundus with a  ?                          cold forceps for histology. Estimated blood loss  ?                          was minimal. ?                          The examined duodenum was normal. Biopsies were  ?                          taken with a cold forceps for histology. Estimated  ?                          blood loss was minimal. ?                          The cardia and gastric fundus were normal on  ?  retroflexion. ?                          The exam was otherwise without abnormality. ?Complications:            No immediate complications. ?Estimated Blood Loss:     Estimated blood loss was minimal. ?Impression:               - Normal esophagus. Biopsied. ?                          - Normal stomach. Biopsied. ?                          - Normal examined duodenum. Biopsied. ?                          - The examination was otherwise normal. ?Recommendation:           - Patient has a contact number available for  ?                          emergencies. The signs and symptoms of potential  ?                          delayed complications were discussed with the  ?                          patient. Return to normal activities tomorrow.  ?                          Written discharge instructions were provided to the  ?                          patient. ?                          - Resume previous diet. ?                          - Continue present medications. ?                           - Await pathology results. ?Thornton Park MD, MD ?01/16/2022 10:42:36 AM ?This report has been signed electronically. ?

## 2022-01-16 NOTE — Progress Notes (Signed)
No problems noted in the recovery room. maw 

## 2022-01-16 NOTE — Patient Instructions (Addendum)
You may resume your current medications today. Await biopsy results.  May take 1-3 weeks to receive pathology results. Please call if any questions or concerns.    YOU HAD AN ENDOSCOPIC PROCEDURE TODAY AT THE [facility]:   Refer to the procedure report that was given to you for any specific questions about what was found during the examination.  If the procedure report does not answer your questions, please call your gastroenterologist to clarify.  If you requested that your care partner not be given the details of your procedure findings, then the procedure report has been included in a sealed envelope for you to review at your convenience later.  YOU SHOULD EXPECT: Some feelings of bloating in the abdomen. Passage of more gas than usual.  Walking can help get rid of the air that was put into your GI tract during the procedure and reduce the bloating. If you had a lower endoscopy (such as a colonoscopy or flexible sigmoidoscopy) you may notice spotting of blood in your stool or on the toilet paper. If you underwent a bowel prep for your procedure, you may not have a normal bowel movement for a few days.  Please Note:  You might notice some irritation and congestion in your nose or some drainage.  This is from the oxygen used during your procedure.  There is no need for concern and it should clear up in a day or so.  SYMPTOMS TO REPORT IMMEDIATELY:   Following upper endoscopy (EGD)  Vomiting of blood or coffee ground material  New chest pain or pain under the shoulder blades  Painful or persistently difficult swallowing  New shortness of breath  Fever of 100F or higher  Black, tarry-looking stools  For urgent or emergent issues, a gastroenterologist can be reached at any hour by calling [phone]. Do not use MyChart messaging for urgent concerns.    DIET:  We do recommend a small meal at first, but then you may proceed to your regular diet.  Drink plenty of fluids but  you should avoid alcoholic beverages for 24 hours.  ACTIVITY:  You should plan to take it easy for the rest of today and you should NOT DRIVE or use heavy machinery until tomorrow (because of the sedation medicines used during the test).    FOLLOW UP: Our staff will call the number listed on your records 48-72 hours following your procedure to check on you and address any questions or concerns that you may have regarding the information given to you following your procedure. If we do not reach you, we will leave a message.  We will attempt to reach you two times.  During this call, we will ask if you have developed any symptoms of COVID 19. If you develop any symptoms (ie: fever, flu-like symptoms, shortness of breath, cough etc.) before then, please call [phone].  If you test positive for Covid 19 in the 2 weeks post procedure, please call and report this information to us.    If any biopsies were taken you will be contacted by phone or by letter within the next 1-3 weeks.  Please call us at [phone] if you have not heard about the biopsies in 3 weeks.    SIGNATURES/CONFIDENTIALITY: You and/or your care partner have signed paperwork which will be entered into your electronic medical record.  These signatures attest to the fact that that the information above on your After Visit Summary has been reviewed and is understood.    Full responsibility of the confidentiality of this discharge information lies with you and/or your care-partner.    

## 2022-01-16 NOTE — Progress Notes (Signed)
A and O x3. Report to RN. Tolerated MAC anesthesia well.Teeth unchanged after procedure. 

## 2022-01-16 NOTE — Progress Notes (Signed)
Called to room to assist during endoscopic procedure.  Patient ID and intended procedure confirmed with present staff. Received instructions for my participation in the procedure from the performing physician.  

## 2022-01-16 NOTE — Progress Notes (Signed)
? ?  Referring Provider: Shirlean Kelly, MD ?Primary Care Physician:  Shirlean Kelly, MD ? ?Indication for EGD:  Near constant left upper abdominal pain ? ? ?IMPRESSION:  ?Near constant left upper abdominal pain ?Appropriate candidate for monitored anesthesia care ? ?PLAN: ?EGD in the [facility] today ? ? ?HPI: Tiffany Fuller is a 51 y.o. female presents for endoscopic evaluation of abdominal pain. ? ?She reports intermittent left upper abdominal pain over the last 1-2 years. Pain is located under her left rib and intermittently radiates around to the back. It feels like "something is inflamed."  It is there constantly as long as she thinks about. When distracted, she is not aware of the pain. Reports some bloating with foods that she treats with charcoal. No diarrhea, constipation, or change in bowel habits.  ?  ?Colonoscopy with Dr. Loreta Ave in October 2021 was normal per the patient report.  These symptoms were present at that time of her colonoscopy. No upper endoscopy performed.   ?  ?Has not treated any specific medications to treat the symptoms.  ?  ?Rare use of Aleve for headache or Tylenol for fever.  ?  ?She has been researching online and wonders if it's her stomach or her pancreas.  ? ? ?Past Medical History:  ?Diagnosis Date  ? Hepatitis A   ? as a child  ? ? ?Past Surgical History:  ?Procedure Laterality Date  ? CESAREAN SECTION    ? x 2  ? TUBAL LIGATION    ? ? ?Current Outpatient Medications  ?Medication Sig Dispense Refill  ? MULTIPLE VITAMIN PO Take by mouth daily.    ? ?No current facility-administered medications for this visit.  ? ? ?Allergies as of 01/16/2022  ? (No Known Allergies)  ? ? ?Family History  ?Problem Relation Age of Onset  ? Thyroid disease Mother   ? Liver cancer Father   ? Breast cancer Maternal Grandmother   ? Ovarian cancer Maternal Aunt   ? Breast cancer Maternal Aunt   ? Colon cancer Neg Hx   ? Esophageal cancer Neg Hx   ? ? ? ?Physical Exam: ?General:   Alert,  well-nourished, pleasant  and cooperative in NAD ?Head:  Normocephalic and atraumatic. ?Eyes:  Sclera clear, no icterus.   Conjunctiva pink. ?Mouth:  No deformity or lesions.   ?Neck:  Supple; no masses or thyromegaly. ?Lungs:  Clear throughout to auscultation.   No wheezes. ?Heart:  Regular rate and rhythm; no murmurs. ?Abdomen:  Soft, non-tender, nondistended, normal bowel sounds, no rebound or guarding.  ?Msk:  Symmetrical. No boney deformities ?LAD: No inguinal or umbilical LAD ?Extremities:  No clubbing or edema. ?Neurologic:  Alert and  oriented x4;  grossly nonfocal ?Skin:  No obvious rash or bruise. ?Psych:  Alert and cooperative. Normal mood and affect. ? ? ? ? ?Studies/Results: ?No results found. ? ? ? ?Hagop Mccollam L. Orvan Falconer, MD, MPH ?01/16/2022, 9:05 AM ? ? ? ?  ?

## 2022-01-18 ENCOUNTER — Telehealth: Payer: Self-pay | Admitting: *Deleted

## 2022-01-18 NOTE — Telephone Encounter (Signed)
?  Follow up Call- ? ? ?  01/16/2022  ?  9:31 AM  ?Call back number  ?Post procedure Call Back phone  # [phone]  ?Permission to leave phone message Yes  ?  ? ?Patient questions: ? ?Do you have a fever, pain , or abdominal swelling? No. ?Pain Score  0 * ? ?Have you tolerated food without any problems? Yes.   ? ?Have you been able to return to your normal activities? Yes.   ? ?Do you have any questions about your discharge instructions: ?Diet   No. ?Medications  No. ?Follow up visit  No. ? ?Do you have questions or concerns about your Care? No. ? ?Actions: ?* If pain score is 4 or above: ?No action needed, pain <4. ? ? ?

## 2022-01-19 ENCOUNTER — Other Ambulatory Visit: Payer: Self-pay

## 2022-01-19 DIAGNOSIS — R1012 Left upper quadrant pain: Secondary | ICD-10-CM

## 2022-01-19 MED ORDER — PANTOPRAZOLE SODIUM 40 MG PO TBEC: 40.0000 mg | DELAYED_RELEASE_TABLET | Freq: Every day | ORAL | 3 refills | Status: AC

## 2022-01-19 MED ORDER — PANTOPRAZOLE SODIUM 40 MG PO TBEC: 40.0000 mg | DELAYED_RELEASE_TABLET | Freq: Two times a day (BID) | ORAL | 0 refills | Status: DC

## 2022-02-07 NOTE — Progress Notes (Signed)
02/13/2022 Tiffany Fuller [id number] 1971-02-17  Referring provider: Shirlean Kelly, MD Primary GI doctor: Dr. Orvan Falconer  ASSESSMENT AND PLAN:   Abdominal pain, left upper quadrant 12/14/2021 CT abdomen pelvis with contrast elevated LFTs left upper quadrant pain showed constipation otherwise no abnormality normal pancreas, normal liver, no gallstones, gallbladder wall thickening or biliary dilatation. 01/16/2022 EGD  Normal esophagus, normal stomach normal duodenum showed gastritis negative for H. pylori.  No symptoms of GERD, constant pain without  accompaniments and not better with PPI.  Uncertain if this is GI related with normal work up so far.  Suggest trying salon pas patches/voltern gel with some back pain Can follow up with PCP for possible thoracic Xray versus PT referral.  Can do trial of IBGARD and FODMAP Will monitor symptoms and follow up if any new or progressive symptoms.   Abnormal liver enzymes --Continue to work on risk factor modification including diet exercise and control of risk factors including blood sugars. - monitor q 6 months with PCP Normal CT scan   History of Present Illness:  51 y.o. female  with a past medical history listed below, returns to clinic today for evaluation of reflux and left upper quadrant pain.,  LFTs elevated.  06/2020 normal colonoscopy with Dr. Loreta Ave 12/06/2021 saw Dr. Orvan Falconer in the office with trial of dicyclomine for constant left upper quadrant abdominal pain, scheduled for EGD and CT abdomen pelvis with contrast 12/14/2021 CT abdomen pelvis with contrast elevated LFTs left upper quadrant pain showed constipation otherwise no abnormality normal pancreas, normal liver, no gallstones, gallbladder wall thickening or biliary dilatation. 01/16/2022 EGD  Normal esophagus, normal stomach normal duodenum showed gastritis negative for H. pylori.   Placed on pantoprazole 40 mg twice daily for 8 weeks and then reduce to 40 mg daily.  She  states the medication is not helping. Nothing changed or improved.  She continues to have AB pain on left upper AB.  She has constant pain under her rib cage for at least a years, no radiation.  Not related to food, not related to movement, or breathing.   She denies nausea, vomiting. She has normal BM's. No increase gas unless eating eats.  She has some AB bloating depending on what food helps. Worse with eating out at a restaurant.  No rashes.  No feeling of burning/acid, dysphagia. .  Denies SOB, wheezing. Has rare cough.   Current Medications:        Current Outpatient Medications (Other):    [START ON 03/15/2022] pantoprazole (PROTONIX) 40 MG tablet, Take 1 tablet (40 mg total) by mouth daily.  Surgical History:  She  has a past surgical history that includes Cesarean section and Tubal ligation. Family History:  Her family history includes Breast cancer in her maternal aunt and maternal grandmother; Liver cancer in her father; Ovarian cancer in her maternal aunt; Thyroid disease in her mother. Social History:   reports that she has never smoked. She has never used smokeless tobacco. She reports that she does not drink alcohol and does not use drugs.  Current Medications, Allergies, Past Medical History, Past Surgical History, Family History and Social History were reviewed in [employer] record.  Physical Exam: BP 108/60   Pulse 64   Ht 5\' 4"  (1.626 m)   Wt 148 lb (67.1 kg)   LMP 09/01/2012   BMI 25.40 kg/m  General:   Pleasant, well developed female in no acute distress Heart : Regular rate and rhythm; no murmurs  Pulm: Clear anteriorly; no wheezing Abdomen:  Soft, Flat AB, Active bowel sounds. VERY mild tenderness in the LUQ. Without guarding and Without rebound, No organomegaly appreciated.No rash. No left sided back pain.  Rectal: Not evaluated Extremities:  without  edema. Neurologic:  Alert and  oriented x4;  No focal deficits.  Psych:   Cooperative. Normal mood and affect.   Doree Albee, PA-C 02/13/22

## 2022-02-13 ENCOUNTER — Encounter: Payer: Self-pay | Admitting: Physician Assistant

## 2022-02-13 ENCOUNTER — Ambulatory Visit: Payer: BC Managed Care – PPO | Admitting: Physician Assistant

## 2022-02-13 VITALS — BP 108/60 | HR 64 | Ht 64.0 in | Wt 148.0 lb

## 2022-02-13 DIAGNOSIS — R1012 Left upper quadrant pain: Secondary | ICD-10-CM | POA: Diagnosis not present

## 2022-02-13 DIAGNOSIS — R748 Abnormal levels of other serum enzymes: Secondary | ICD-10-CM

## 2022-02-13 NOTE — Patient Instructions (Addendum)
Do trial of salon pas patches are over the counter and voltern gel is topical antiinflammatory that is safe and see if this helps the pain.   Please try low FODMAP diet Try trial off milk/lactose products.   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    Avoid spicy and acidic foods Avoid fatty foods Limit your intake of coffee, tea, alcohol, and carbonated drinks Work to maintain a healthy weight Keep the head of the bed elevated at least 3 inches with blocks or a wedge pillow if you are having any nighttime symptoms Stay upright for 2 hours after eating Avoid meals and snacks three to four hours before bedtime Gastroesophageal Reflux Disease, Adult Gastroesophageal reflux (GER) happens when acid from the stomach flows up into the tube that connects the mouth and the stomach (esophagus). Normally, food travels down the esophagus and stays in the stomach to be digested. However, when a person has GER, food and stomach acid sometimes move back up into the esophagus. If this becomes a more serious problem, the person may be diagnosed with a disease called gastroesophageal reflux disease (GERD). GERD occurs when the reflux: Happens often. Causes frequent or severe symptoms. Causes problems such as damage to the esophagus. When stomach acid comes in contact with the esophagus, the acid may cause inflammation in the esophagus. Over time, GERD may create small holes (ulcers) in the lining of the esophagus. What are the causes? This condition is caused by a problem with the muscle between the esophagus and the stomach (lower esophageal sphincter, or LES). Normally, the LES muscle closes after food passes through the esophagus to the stomach. When the LES is weakened or abnormal, it does not close properly, and that allows food and stomach  acid to go back up into the esophagus. The LES can be weakened by certain dietary substances, medicines, and medical conditions, including: Tobacco use. Pregnancy. Having a hiatal hernia. Alcohol use. Certain foods and beverages, such as coffee, chocolate, onions, and peppermint. What increases the risk? You are more likely to develop this condition if you: Have an increased body weight. Have a connective tissue disorder. Take NSAIDs, such as ibuprofen. What are the signs or symptoms? Symptoms of this condition include: Heartburn. Difficult or painful swallowing and the feeling of having a lump in the throat. A bitter taste in the mouth. Bad breath and having a large amount of saliva. Having an upset or bloated stomach and belching. Chest pain. Different conditions can cause chest pain. Make sure you see your health care provider if you experience chest pain. Shortness of breath or wheezing. Ongoing (chronic) cough or a nighttime cough. Wearing away of tooth enamel. Weight loss. How is this diagnosed? This condition may be diagnosed based on a medical history and a physical exam. To determine if you have mild or severe GERD, your health care provider may also monitor how you respond to treatment. You may also have tests, including: A test to examine your stomach and esophagus with a small camera (endoscopy). A test that measures the acidity level in your esophagus. A test that measures how much pressure is on your esophagus. A barium swallow or modified barium swallow test to show the shape, size, and functioning of your esophagus. How is this treated? Treatment for this condition may vary depending  on how severe your symptoms are. Your health care provider may recommend: Changes to your diet. Medicine. Surgery. The goal of treatment is to help relieve your symptoms and to prevent complications. Follow these instructions at home: Eating and drinking  Follow a diet as  recommended by your health care provider. This may involve avoiding foods and drinks such as: Coffee and tea, with or without caffeine. Drinks that contain alcohol. Energy drinks and sports drinks. Carbonated drinks or sodas. Chocolate and cocoa. Peppermint and mint flavorings. Garlic and onions. Horseradish. Spicy and acidic foods, including peppers, chili powder, curry powder, vinegar, hot sauces, and barbecue sauce. Citrus fruit juices and citrus fruits, such as oranges, lemons, and limes. Tomato-based foods, such as red sauce, chili, salsa, and pizza with red sauce. Fried and fatty foods, such as donuts, french fries, potato chips, and high-fat dressings. High-fat meats, such as hot dogs and fatty cuts of red and white meats, such as rib eye steak, sausage, ham, and bacon. High-fat dairy items, such as whole milk, butter, and cream cheese. Eat small, frequent meals instead of large meals. Avoid drinking large amounts of liquid with your meals. Avoid eating meals during the 2-3 hours before bedtime. Avoid lying down right after you eat. Do not exercise right after you eat. Lifestyle  Do not use any products that contain nicotine or tobacco. These products include cigarettes, chewing tobacco, and vaping devices, such as e-cigarettes. If you need help quitting, ask your health care provider. Try to reduce your stress by using methods such as yoga or meditation. If you need help reducing stress, ask your health care provider. If you are overweight, reduce your weight to an amount that is healthy for you. Ask your health care provider for guidance about a safe weight loss goal. General instructions Pay attention to any changes in your symptoms. Take over-the-counter and prescription medicines only as told by your health care provider. Do not take aspirin, ibuprofen, or other NSAIDs unless your health care provider told you to take these medicines. Wear loose-fitting clothing. Do not wear  anything tight around your waist that causes pressure on your abdomen. Raise (elevate) the head of your bed about 6 inches (15 cm). You can use a wedge to do this. Avoid bending over if this makes your symptoms worse. Keep all follow-up visits. This is important. Contact a health care provider if: You have: New symptoms. Unexplained weight loss. Difficulty swallowing or it hurts to swallow. Wheezing or a persistent cough. A hoarse voice. Your symptoms do not improve with treatment. Get help right away if: You have sudden pain in your arms, neck, jaw, teeth, or back. You suddenly feel sweaty, dizzy, or light-headed. You have chest pain or shortness of breath. You vomit and the vomit is green, yellow, or black, or it looks like blood or coffee grounds. You faint. You have stool that is red, bloody, or black. You cannot swallow, drink, or eat. These symptoms may represent a serious problem that is an emergency. Do not wait to see if the symptoms will go away. Get medical help right away. Call your local emergency services (911 in the [place] Do not drive yourself to the hospital. Summary Gastroesophageal reflux happens when acid from the stomach flows up into the esophagus. GERD is a disease in which the reflux happens often, causes frequent or severe symptoms, or causes problems such as damage to the esophagus. Treatment for this condition may vary depending on how severe your symptoms are. Your  health care provider may recommend diet and lifestyle changes, medicine, or surgery. Contact a health care provider if you have new or worsening symptoms. Take over-the-counter and prescription medicines only as told by your health care provider. Do not take aspirin, ibuprofen, or other NSAIDs unless your health care provider told you to do so. Keep all follow-up visits as told by your health care provider. This is important. This information is not intended to replace advice given to you by your  health care provider. Make sure you discuss any questions you have with your health care provider. Document Revised: 03/07/2020 Document Reviewed: 03/07/2020 [employer] Patient Education  2022 [employer]     Consider keeping a food diary- common causes of diarrhea are dairy, certain carbs...   FODMAP stands for fermentable oligo-, di-, mono-saccharides and polyols (1). These are the scientific terms used to classify groups of carbs that are notorious for triggering digestive symptoms like bloating, gas and stomach pain.   FODMAPs are found in a wide range of foods in varying amounts. Some foods contain just one type, while others contain several.  The main dietary sources of the four groups of FODMAPs include:  Oligosaccharides: Wheat, rye, legumes and various fruits and vegetables, such as garlic and onions.  Disaccharides: Milk, yogurt and soft cheese. Lactose is the main carb.  Monosaccharides: Various fruit including figs and mangoes, and sweeteners such as honey and agave nectar. Fructose is the main carb.  Polyols: Certain fruits and vegetables including blackberries and lychee, as well as some low-calorie sweeteners like those in sugar-free gum.   Keep a food diary. This will help you identify foods that cause symptoms. Write down: What you eat and when. What symptoms you have. When symptoms occur in relation to your meals. Avoid foods that cause symptoms. Talk with your dietitian about other ways to get the same nutrients that are in these foods. Eat your meals slowly, in a relaxed setting. Aim to eat 5-6 small meals per day. Do not skip meals. Drink enough fluids to keep your urine clear or pale yellow. If dairy products cause your symptoms to flare up, try eating less of them. You might be able to handle yogurt better than other dairy products because it contains bacteria that help with digestion.

## 2022-02-13 NOTE — Progress Notes (Signed)
Reviewed and agree with management plans. ? ?Javier Gell L. Jacalyn Biggs, MD, MPH  ?

## 2023-11-21 ENCOUNTER — Other Ambulatory Visit: Payer: Self-pay | Admitting: Obstetrics and Gynecology

## 2023-11-21 DIAGNOSIS — Z1231 Encounter for screening mammogram for malignant neoplasm of breast: Secondary | ICD-10-CM

## 2024-01-29 ENCOUNTER — Ambulatory Visit
Admission: RE | Admit: 2024-01-29 | Discharge: 2024-01-29 | Disposition: A | Discharge status: Home / Self Care | Source: Ambulatory Visit | Attending: Obstetrics and Gynecology | Admitting: Obstetrics and Gynecology

## 2024-01-29 DIAGNOSIS — Z1231 Encounter for screening mammogram for malignant neoplasm of breast: Secondary | ICD-10-CM
